# Patient Record
Sex: Male | Born: 1970 | Race: White | Hispanic: No | Marital: Single | Smoking: Former smoker
Health system: Southern US, Community
[De-identification: ages and names within clinical notes are randomized; demographics above are authoritative.]

## PROBLEM LIST (undated history)

## (undated) DIAGNOSIS — E1110 Type 2 diabetes mellitus with ketoacidosis without coma: Secondary | ICD-10-CM

## (undated) DIAGNOSIS — E785 Hyperlipidemia, unspecified: Secondary | ICD-10-CM

## (undated) DIAGNOSIS — H9190 Unspecified hearing loss, unspecified ear: Secondary | ICD-10-CM

## (undated) DIAGNOSIS — E119 Type 2 diabetes mellitus without complications: Secondary | ICD-10-CM

## (undated) DIAGNOSIS — N200 Calculus of kidney: Secondary | ICD-10-CM

## (undated) DIAGNOSIS — C119 Malignant neoplasm of nasopharynx, unspecified: Secondary | ICD-10-CM

## (undated) DIAGNOSIS — Z974 Presence of external hearing-aid: Secondary | ICD-10-CM

## (undated) DIAGNOSIS — S0993XA Unspecified injury of face, initial encounter: Secondary | ICD-10-CM

## (undated) HISTORY — DX: Type 2 diabetes mellitus without complications: E11.9

## (undated) HISTORY — PX: COLONOSCOPY: SHX174

## (undated) HISTORY — DX: Calculus of kidney: N20.0

## (undated) HISTORY — DX: Hyperlipidemia, unspecified: E78.5

## (undated) HISTORY — PX: NASOPHARYNGEAL BIOPSY: SHX6488

## (undated) HISTORY — PX: LARYNGOSCOPY: SUR817

## (undated) HISTORY — DX: Type 2 diabetes mellitus with ketoacidosis without coma: E11.10

---

## 1998-03-26 ENCOUNTER — Emergency Department (HOSPITAL_COMMUNITY): Admission: EM | Admit: 1998-03-26 | Discharge: 1998-03-26 | Payer: Self-pay | Admitting: Emergency Medicine

## 2005-07-13 ENCOUNTER — Emergency Department (HOSPITAL_COMMUNITY): Admission: EM | Admit: 2005-07-13 | Discharge: 2005-07-13 | Payer: Self-pay | Admitting: Emergency Medicine

## 2005-07-14 ENCOUNTER — Encounter: Admission: RE | Admit: 2005-07-14 | Discharge: 2005-07-14 | Payer: Self-pay | Admitting: Otolaryngology

## 2005-08-29 DIAGNOSIS — C119 Malignant neoplasm of nasopharynx, unspecified: Secondary | ICD-10-CM

## 2005-08-29 HISTORY — PX: PORT-A-CATH REMOVAL: SHX5289

## 2005-08-29 HISTORY — DX: Malignant neoplasm of nasopharynx, unspecified: C11.9

## 2005-08-29 HISTORY — PX: GASTROSTOMY W/ FEEDING TUBE: SUR642

## 2006-02-15 ENCOUNTER — Inpatient Hospital Stay (HOSPITAL_COMMUNITY): Admission: EM | Admit: 2006-02-15 | Discharge: 2006-02-26 | Payer: Self-pay | Admitting: Emergency Medicine

## 2006-02-15 ENCOUNTER — Ambulatory Visit: Payer: Self-pay | Admitting: Cardiology

## 2006-02-15 ENCOUNTER — Ambulatory Visit: Payer: Self-pay | Admitting: Pulmonary Disease

## 2006-05-28 IMAGING — CT CT NECK W/ CM
3 of 5 series · 14 of 27 positions shown, 17 images · IV contrast (75ML OMNI 300)
Comparison: none

CLINICAL DATA: 34 year old male with right tonsillar mass. 
CT NECK WITH CONTRAST:
TECHNIQUE: Multidetector CT imaging of the neck was performed following the standard protocol during administration of intravenous contrast.
Contrast:  75 cc Omnipaque 300

[Series 2: neck w/ · axial · 0.43mm/px · z∈[-266,-78]mm · 4 of 127 slices shown (1 of 2)]
[im 26/127  bone]
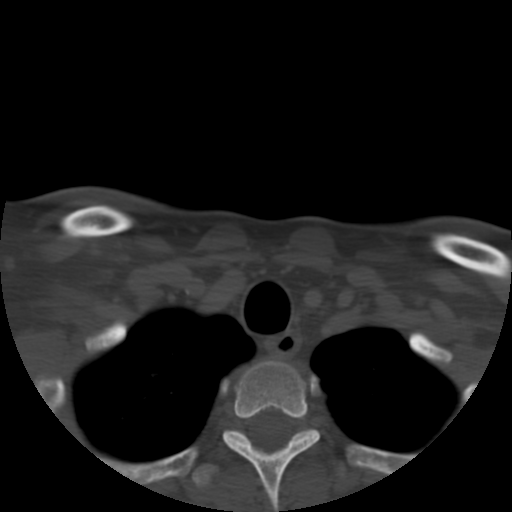
[im 51/127  bone]
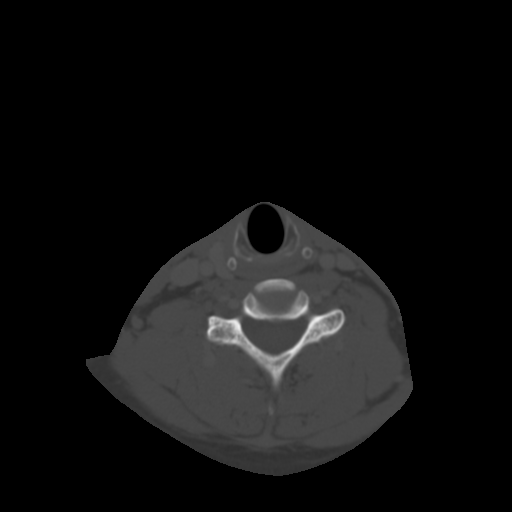
[im 76/127  bone]
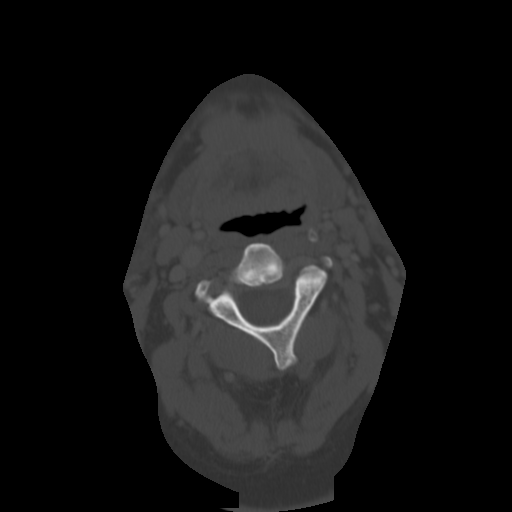
[im 101/127  bone]
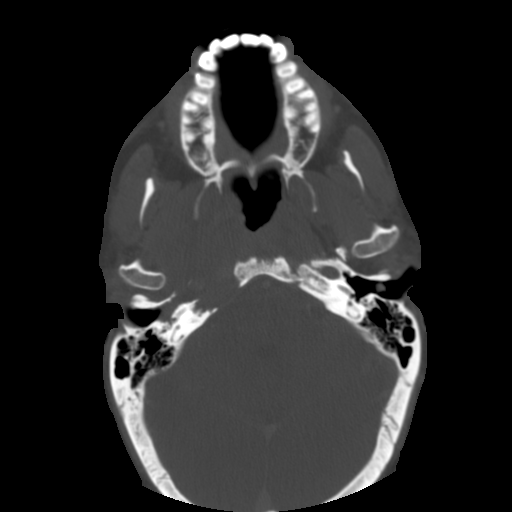

[Series 102: neck w/ · axial · 0.43mm/px · z∈[-276,-66]mm · 5 of 127 slices shown, 7 images (2 of 2)]
[im 22/127  soft-tissue]
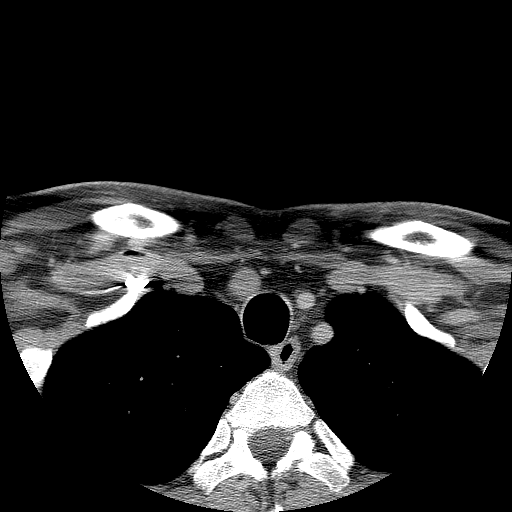
[im 22/127  bone]
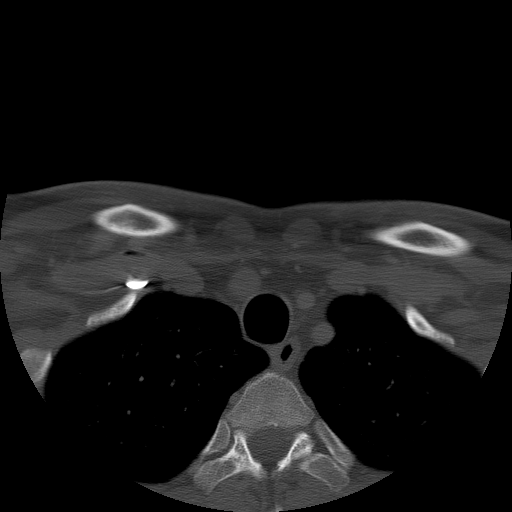
[im 43/127  bone]
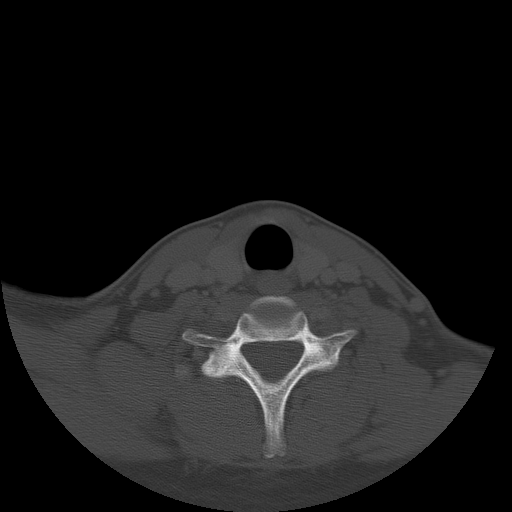
[im 64/127  bone]
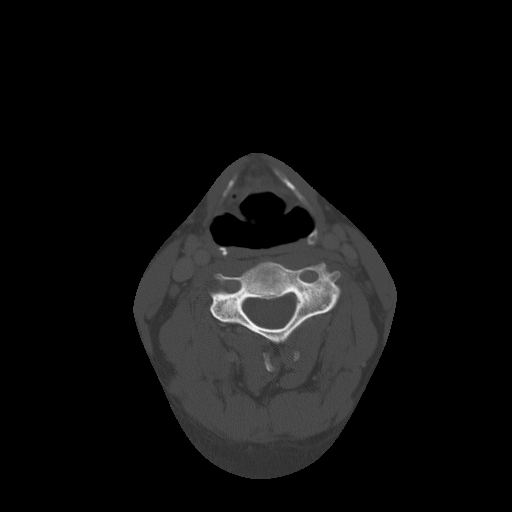
[im 85/127  bone]
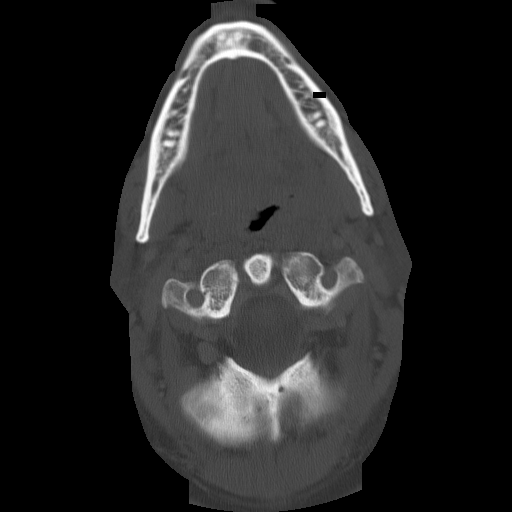
[im 106/127  soft-tissue]
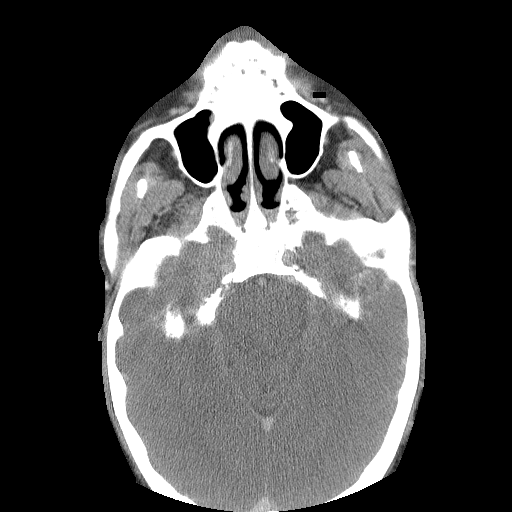
[im 106/127  bone]
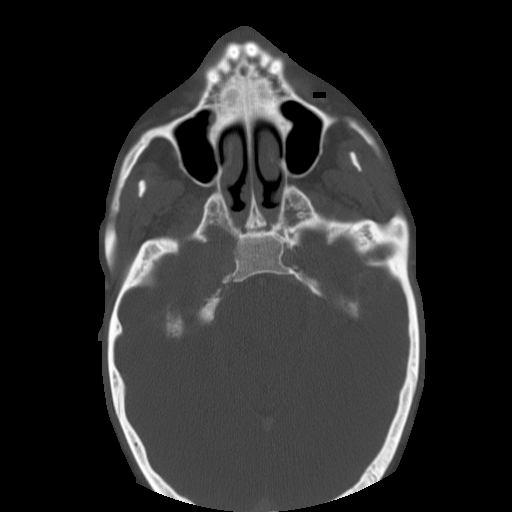

[Series 547: reformatted · coronal · 0.62mm/px · 5 of 65 slices shown, 6 images]
[im 10/65  bone]
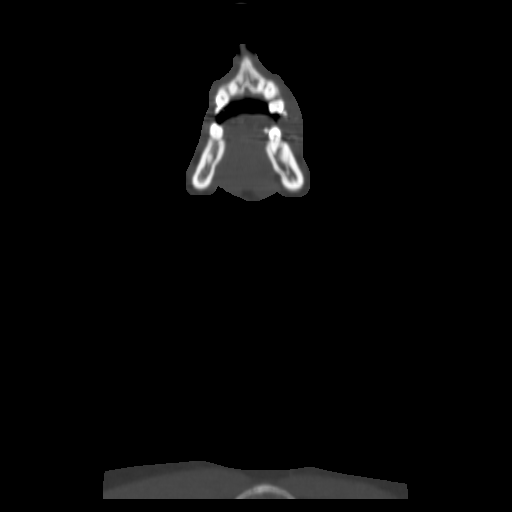
[im 19/65  soft-tissue]
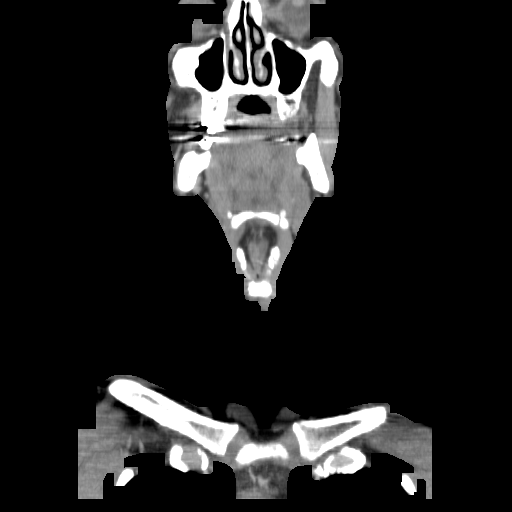
[im 19/65  bone]
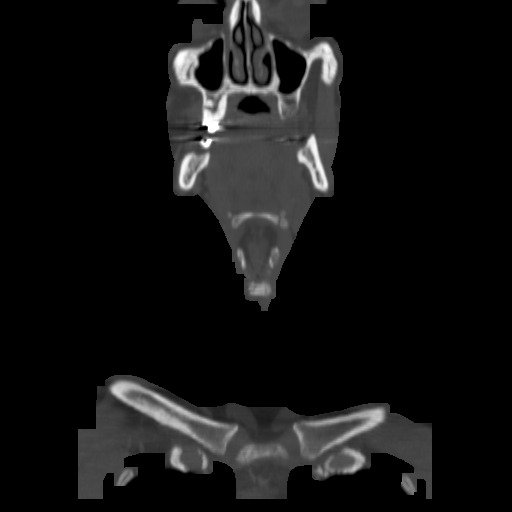
[im 28/65  bone]
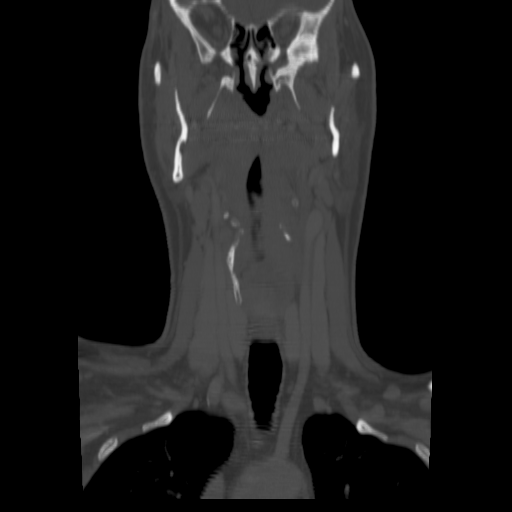
[im 37/65  bone]
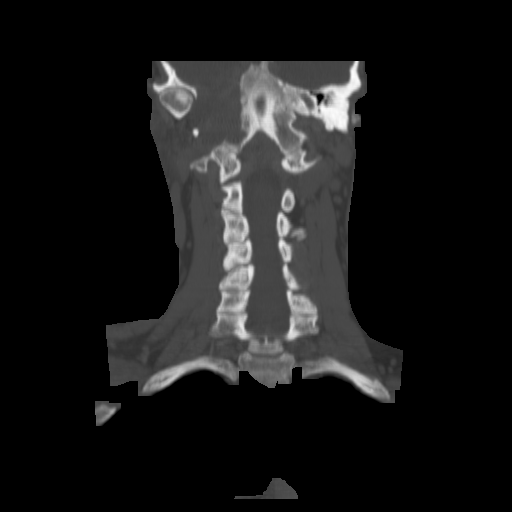
[im 46/65  bone]
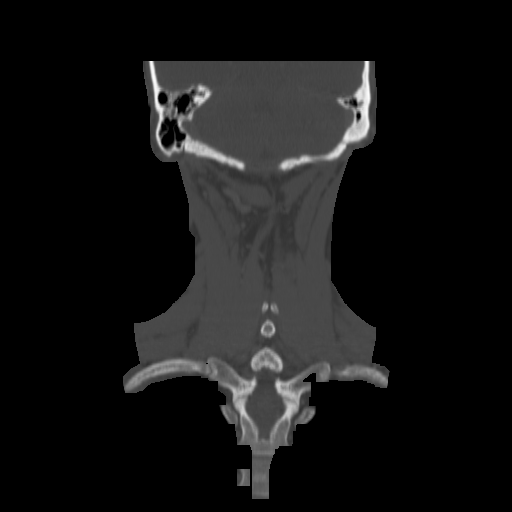

[14 of 27 positions shown; findings below may reference images not displayed]

FINDINGS: A 5.5 x 4.9 x 6.6 cm ill-defined mass is centered within the right palatine tonsillar region.  This displaces the tongue base anteriorly.  There may be extension into the tongue base .  The tumor extends superiorly through the skull base along the right petroclival fissure.  There is extensive bone erosion in the base of the right middle cranial fossa.  Tumor surrounds the hypoglossal canal, the petrous carotid canal and jugular foramen.  Intracranially, there is outward convexity of the right cavernous sinus suggestive of tumor infiltration, likely along the V3 branch.  The tumor margin is inseparable from the pterygoid musculature suggesting invasion into the masticator space as well.  
  The largest lymph node on the right side is a jugulodigastric node measuring 1.9 cm.  A rounded level II node on the left measures 1.8 cm in long axis.  Additional left level two enlarged nodes measure up to 1.7 cm in long axis.  No definite level III nodes are identified.  The larynx is unremarkable.
IMPRESSION: 1.  5.5 x 4.9 x 6.6 cm skull based mass lesion appears to be centered in the region of the right palatine tonsil.  This erodes the skull base on the right along the petroclival fissure.  There is strong suggestion of invasion into the cavernous sinus on the right as well as invasion into the right masticator space and potentially the tongue base.  Differential diagnosis includes a adenoid cystic carcinoma or less likely mucoepidermoid carcinoma.  Although there are several enlarged lymph nodes, these do not have the typical necrotic appearance of squamous cell cancer.  Also included in the differential is a chondrosarcoma given its location.  However, I do not see significant matrix to support this diagnosis.  
2.  There is significant involvement of the petrous cavernous carotid artery, and mass effect on the eustachian tube with some fluid in the mastoid air cells on the right.  The right V3 nerve, ninth, tenth, eleventh and twelfth nerves are also all potentially affected.  
These findings were discussed with Dr. Reema Street at [DATE], 07/14/05.

## 2007-01-01 IMAGING — XA IR PERC PLACEMENT GASTROSTOMY
1 series · 2 of 2 positions shown · non-contrast
Comparison: none

CLINICAL DATA: 18 French gastrostomy tube. 
 GASTROSTOMY TUBE EXCHANGE 02/17/06 AT 2003 HOURS:
 Procedure:  The existing gastrostomy tube was prepped and draped in sterile fashion.  Contrast was injected through it opacifying the stomach.  The balloon was deflated and it was exchanged for a 20 French balloon retention gastrostomy tube.  10 cc saline was instilled.  The balloon was secure and in place.  Contrast was injected opacifying the stomach.  No complications.

[Series 1000: run · 0.21mm/px · 2 of 2 slices shown]
[im 1/2]
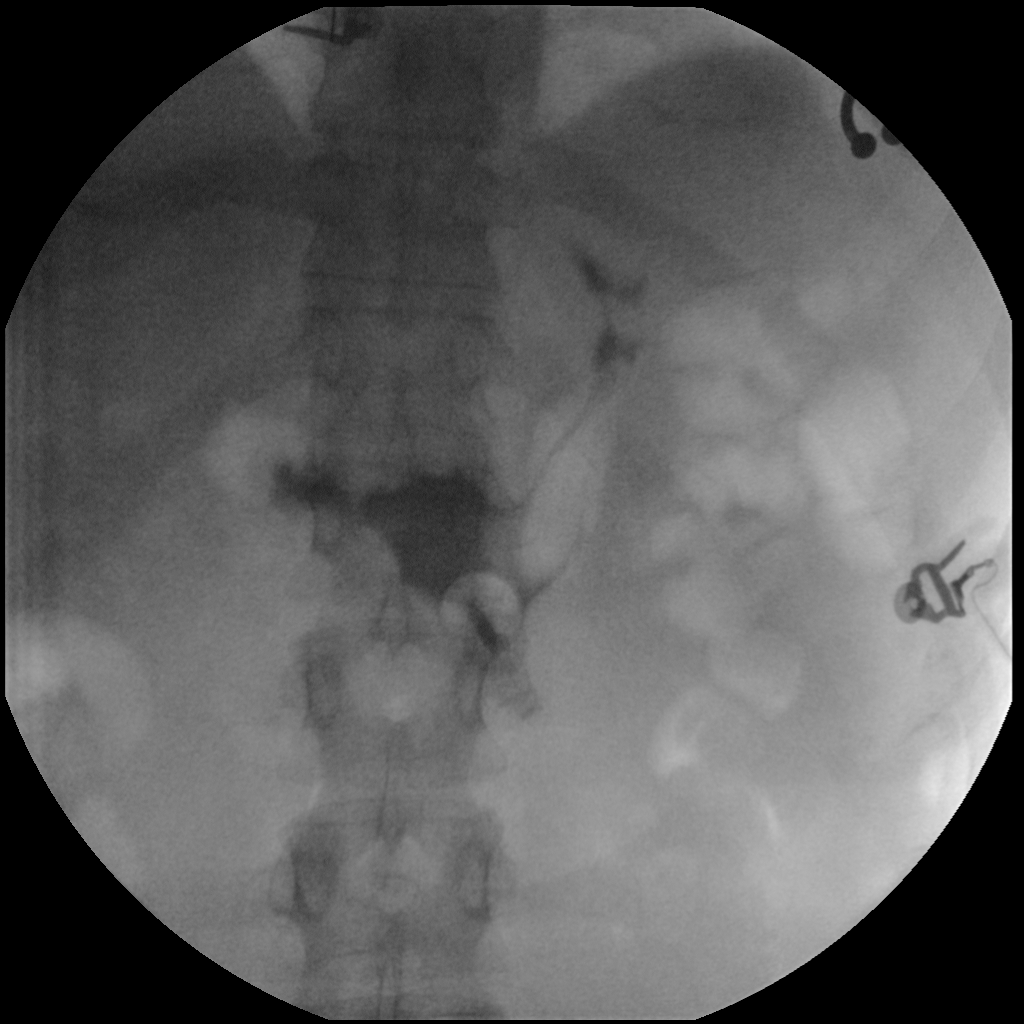
[im 2/2]
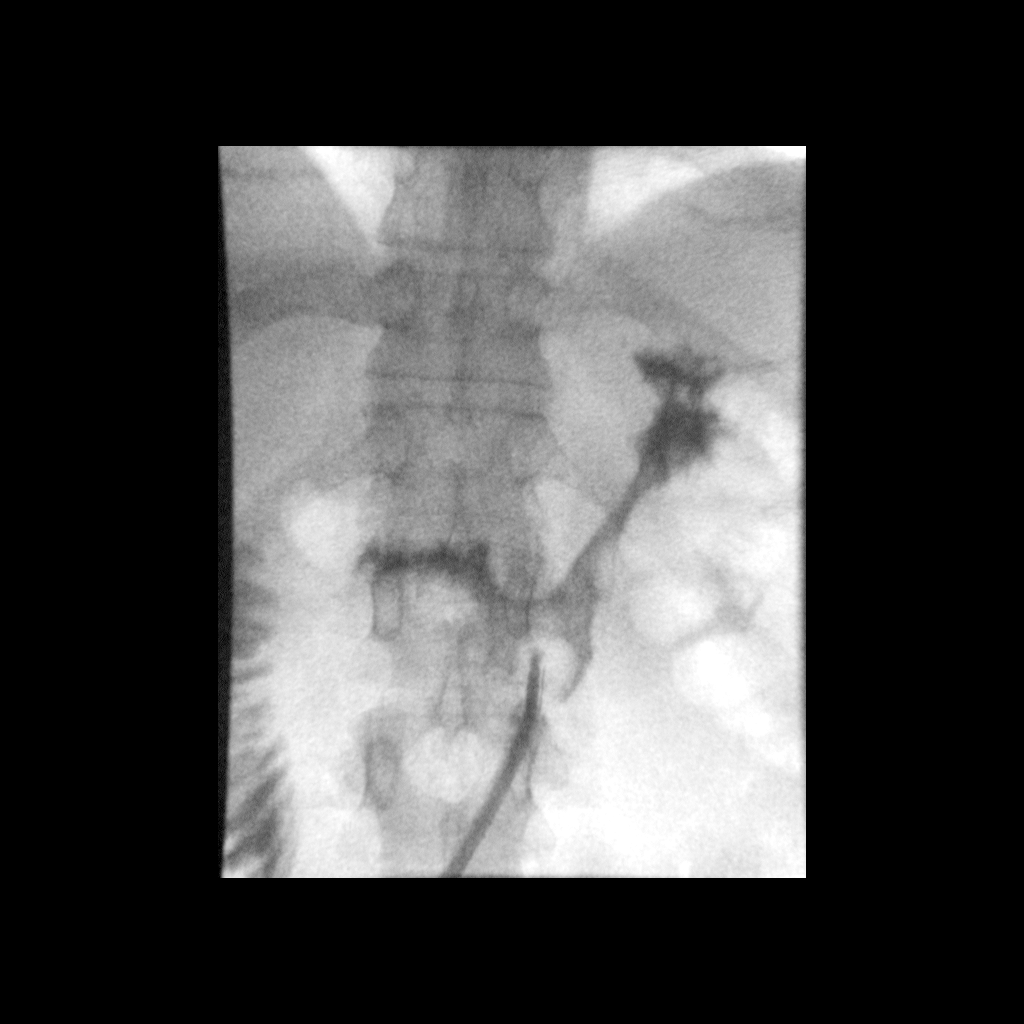

[2 of 2 positions shown; findings below may reference images not displayed]

FINDINGS: The image demonstrates exchange of the gastrostomy tube to a 20 French gastrostomy.
IMPRESSION: Successful gastrostomy tube exchange for 20 French balloon retention tube.

## 2013-12-02 DIAGNOSIS — C119 Malignant neoplasm of nasopharynx, unspecified: Secondary | ICD-10-CM | POA: Insufficient documentation

## 2013-12-02 DIAGNOSIS — M8738 Other secondary osteonecrosis, other site: Secondary | ICD-10-CM | POA: Insufficient documentation

## 2013-12-02 DIAGNOSIS — Y842 Radiological procedure and radiotherapy as the cause of abnormal reaction of the patient, or of later complication, without mention of misadventure at the time of the procedure: Secondary | ICD-10-CM

## 2013-12-06 ENCOUNTER — Telehealth: Payer: Self-pay

## 2013-12-06 NOTE — Telephone Encounter (Signed)
Unable to reach patient.  Invalid number.

## 2013-12-09 ENCOUNTER — Ambulatory Visit: Payer: Self-pay | Admitting: Family Medicine

## 2013-12-09 DIAGNOSIS — Z0289 Encounter for other administrative examinations: Secondary | ICD-10-CM

## 2013-12-11 NOTE — Telephone Encounter (Signed)
Missed appointment

## 2014-07-17 ENCOUNTER — Other Ambulatory Visit (INDEPENDENT_AMBULATORY_CARE_PROVIDER_SITE_OTHER): Payer: Self-pay | Admitting: Surgery

## 2014-10-17 ENCOUNTER — Encounter (HOSPITAL_BASED_OUTPATIENT_CLINIC_OR_DEPARTMENT_OTHER): Payer: Self-pay | Admitting: *Deleted

## 2014-10-17 NOTE — Progress Notes (Signed)
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

## 2014-10-21 ENCOUNTER — Other Ambulatory Visit (INDEPENDENT_AMBULATORY_CARE_PROVIDER_SITE_OTHER): Payer: Self-pay | Admitting: Surgery

## 2014-10-22 NOTE — H&P (Signed)
Jesse Newton  Location: [facility] Patient #: [id number] DOB: [birth year] Single / Language: Cleophus Molt / Race: White Male  History of Present Illness  Patient words: Intial visit LT inguinal hernia.  The patient is a 44 year old male who presents with an inguinal hernia. This is a very pleasant gentleman referred by Dr. Lysle Rubens for evaluation of a symptomatically inguinal hernia. He reports that he has had a hernia for approximately a year and is starting to get larger and causing increasing discomfort. He reports that it easily reduces. He has no obstructive symptoms. The discomfort is mild to moderate. He has no other complaints.   Other Problems  Cancer Diabetes Mellitus Inguinal Hernia Other disease, cancer, significant illness  Past Surgical History  Resection of Stomach  Diagnostic Studies History  Colonoscopy never  Allergies  No Known Drug Allergies11/19/2015  Medication History  No Current Medications  Social History  Alcohol use Occasional alcohol use. Caffeine use Coffee. No drug use Tobacco use Former smoker.  Family History  First Degree Relatives No pertinent family history  Review of Systems  General Not Present- Appetite Loss, Chills, Fatigue, Fever, Night Sweats, Weight Gain and Weight Loss. Skin Not Present- Change in Wart/Mole, Dryness, Hives, Jaundice, New Lesions, Non-Healing Wounds, Rash and Ulcer. HEENT Present- Hearing Loss and Ringing in the Ears. Not Present- Earache, Hoarseness, Nose Bleed, Oral Ulcers, Seasonal Allergies, Sinus Pain, Sore Throat, Visual Disturbances, Wears glasses/contact lenses and Yellow Eyes. Respiratory Not Present- Bloody sputum, Chronic Cough, Difficulty Breathing, Snoring and Wheezing. Breast Not Present- Breast Mass, Breast Pain, Nipple Discharge and Skin Changes. Cardiovascular Not Present- Chest Pain, Difficulty Breathing Lying Down, Leg Cramps, Palpitations, Rapid Heart Rate, Shortness  of Breath and Swelling of Extremities. Gastrointestinal Present- Difficulty Swallowing. Not Present- Abdominal Pain, Bloating, Bloody Stool, Change in Bowel Habits, Chronic diarrhea, Constipation, Excessive gas, Gets full quickly at meals, Hemorrhoids, Indigestion, Nausea, Rectal Pain and Vomiting. Male Genitourinary Present- Frequency. Not Present- Blood in Urine, Change in Urinary Stream, Impotence, Nocturia, Painful Urination, Urgency and Urine Leakage. Musculoskeletal Not Present- Back Pain, Joint Pain, Joint Stiffness, Muscle Pain, Muscle Weakness and Swelling of Extremities. Psychiatric Not Present- Anxiety, Bipolar, Change in Sleep Pattern, Depression, Fearful and Frequent crying. Hematology Not Present- Easy Bruising, Excessive bleeding, Gland problems, HIV and Persistent Infections.   Vitals ( 07/17/2014 3:50 PM Weight: 204.38 lb Height: 75in Body Surface Area: 2.21 m Body Mass Index: 25.54 kg/m Temp.: 98.67F(Temporal)  Pulse: 78 (Regular)  Resp.: 18 (Unlabored)  BP: 124/84 (Sitting, Left Arm, Standard)    Physical Exam ( General Mental Status-Alert. General Appearance-Consistent with stated age. Hydration-Well hydrated. Voice-Normal.  Head and Neck Head-normocephalic, atraumatic with no lesions or palpable masses. Trachea-midline.  Eye Eyeball - Bilateral-Extraocular movements intact. Sclera/Conjunctiva - Bilateral-No scleral icterus.  Chest and Lung Exam Chest and lung exam reveals -quiet, even and easy respiratory effort with no use of accessory muscles and on auscultation, normal breath sounds, no adventitious sounds and normal vocal resonance. Inspection Chest Wall - Normal. Back - normal.  Cardiovascular Cardiovascular examination reveals -normal heart sounds, regular rate and rhythm with no murmurs and normal pedal pulses bilaterally.  Abdomen Inspection Skin - Scar - no surgical scars. Hernias - Inguinal hernia - Left -  Reducible. Palpation/Percussion Palpation and Percussion of the abdomen reveal - Soft, Non Tender, No Rebound tenderness, No Rigidity (guarding) and No hepatosplenomegaly. Auscultation Auscultation of the abdomen reveals - Bowel sounds normal.  Neurologic Neurologic evaluation reveals -alert and oriented x 3 with  no impairment of recent or remote memory. Mental Status-Normal.  Musculoskeletal Normal Exam - Left-Upper Extremity Strength Normal and Lower Extremity Strength Normal. Normal Exam - Right-Upper Extremity Strength Normal, Lower Extremity Weakness.    Assessment & Plan  LEFT INGUINAL HERNIA (550.90  K40.90)  Impression: I discussed the diagnosis with the patient in detail. I discussed repair with mesh. I discussed with the laparoscopic and open techniques. I discussed the risks of surgery. These risks include but are not limited to bleeding, infection, chronic pain, nerve entrapment, recurrent hernia, etc.  He wishes to proceed with laparoscopic left inguinal hernia repair with mesh which will be scheduled.

## 2014-10-23 ENCOUNTER — Ambulatory Visit (HOSPITAL_BASED_OUTPATIENT_CLINIC_OR_DEPARTMENT_OTHER): Payer: 59 | Admitting: Certified Registered"

## 2014-10-23 ENCOUNTER — Encounter (HOSPITAL_BASED_OUTPATIENT_CLINIC_OR_DEPARTMENT_OTHER)
Admission: RE | Disposition: A | Discharge status: Home / Self Care | Payer: Self-pay | Source: Ambulatory Visit | Attending: Surgery

## 2014-10-23 ENCOUNTER — Ambulatory Visit (HOSPITAL_BASED_OUTPATIENT_CLINIC_OR_DEPARTMENT_OTHER)
Admission: RE | Admit: 2014-10-23 | Discharge: 2014-10-23 | Disposition: A | Discharge status: Home / Self Care | Payer: 59 | Source: Ambulatory Visit | Attending: Surgery | Admitting: Surgery

## 2014-10-23 ENCOUNTER — Encounter (HOSPITAL_BASED_OUTPATIENT_CLINIC_OR_DEPARTMENT_OTHER): Payer: Self-pay | Admitting: Certified Registered"

## 2014-10-23 DIAGNOSIS — Z859 Personal history of malignant neoplasm, unspecified: Secondary | ICD-10-CM | POA: Diagnosis not present

## 2014-10-23 DIAGNOSIS — E119 Type 2 diabetes mellitus without complications: Secondary | ICD-10-CM | POA: Diagnosis not present

## 2014-10-23 DIAGNOSIS — K4020 Bilateral inguinal hernia, without obstruction or gangrene, not specified as recurrent: Secondary | ICD-10-CM | POA: Insufficient documentation

## 2014-10-23 DIAGNOSIS — Z87891 Personal history of nicotine dependence: Secondary | ICD-10-CM | POA: Diagnosis not present

## 2014-10-23 DIAGNOSIS — K4090 Unilateral inguinal hernia, without obstruction or gangrene, not specified as recurrent: Secondary | ICD-10-CM | POA: Diagnosis present

## 2014-10-23 HISTORY — DX: Presence of external hearing-aid: Z97.4

## 2014-10-23 HISTORY — PX: INGUINAL HERNIA REPAIR: SHX194

## 2014-10-23 HISTORY — DX: Malignant neoplasm of nasopharynx, unspecified: C11.9

## 2014-10-23 HISTORY — DX: Unspecified injury of face, initial encounter: S09.93XA

## 2014-10-23 HISTORY — DX: Unspecified hearing loss, unspecified ear: H91.90

## 2014-10-23 HISTORY — PX: INSERTION OF MESH: SHX5868

## 2014-10-23 SURGERY — REPAIR, HERNIA, INGUINAL, LAPAROSCOPIC
Anesthesia: General | Laterality: Bilateral

## 2014-10-23 MED ORDER — MORPHINE SULFATE 2 MG/ML IJ SOLN: 1.0000 mg | INTRAMUSCULAR | Status: DC | PRN

## 2014-10-23 MED ORDER — PROPOFOL 10 MG/ML IV BOLUS
INTRAVENOUS | Status: DC | PRN
  Administered 2014-10-23: 200 mg via INTRAVENOUS

## 2014-10-23 MED ORDER — OXYCODONE-ACETAMINOPHEN 5-325 MG PO TABS: 1.0000 | ORAL_TABLET | ORAL | Status: DC | PRN

## 2014-10-23 MED ORDER — MIDAZOLAM HCL 2 MG/2ML IJ SOLN: 1.0000 mg | INTRAMUSCULAR | Status: DC | PRN

## 2014-10-23 MED ORDER — BUPIVACAINE-EPINEPHRINE (PF) 0.5% -1:200000 IJ SOLN
INTRAMUSCULAR | Status: DC | PRN
  Administered 2014-10-23: 10 mL

## 2014-10-23 MED ORDER — PROMETHAZINE HCL 25 MG/ML IJ SOLN: 6.2500 mg | INTRAMUSCULAR | Status: DC | PRN

## 2014-10-23 MED ORDER — MIDAZOLAM HCL 2 MG/2ML IJ SOLN
INTRAMUSCULAR | Status: AC
  Filled 2014-10-23: qty 2

## 2014-10-23 MED ORDER — ONDANSETRON HCL 4 MG/2ML IJ SOLN
INTRAMUSCULAR | Status: DC | PRN
  Administered 2014-10-23: 4 mg via INTRAVENOUS

## 2014-10-23 MED ORDER — SODIUM CHLORIDE 0.9 % IJ SOLN: 3.0000 mL | INTRAMUSCULAR | Status: DC | PRN

## 2014-10-23 MED ORDER — FENTANYL CITRATE 0.05 MG/ML IJ SOLN
25.0000 ug | INTRAMUSCULAR | Status: DC | PRN
  Administered 2014-10-23 (×2): 50 ug via INTRAVENOUS

## 2014-10-23 MED ORDER — FENTANYL CITRATE 0.05 MG/ML IJ SOLN
INTRAMUSCULAR | Status: DC | PRN
  Administered 2014-10-23: 100 ug via INTRAVENOUS
  Administered 2014-10-23 (×2): 50 ug via INTRAVENOUS

## 2014-10-23 MED ORDER — SODIUM CHLORIDE 0.9 % IJ SOLN: 3.0000 mL | Freq: Two times a day (BID) | INTRAMUSCULAR | Status: DC

## 2014-10-23 MED ORDER — SODIUM CHLORIDE 0.9 % IV SOLN: 250.0000 mL | INTRAVENOUS | Status: DC | PRN

## 2014-10-23 MED ORDER — LIDOCAINE HCL (CARDIAC) 20 MG/ML IV SOLN
INTRAVENOUS | Status: DC | PRN
  Administered 2014-10-23: 60 mg via INTRAVENOUS

## 2014-10-23 MED ORDER — ACETAMINOPHEN 650 MG RE SUPP: 650.0000 mg | RECTAL | Status: DC | PRN

## 2014-10-23 MED ORDER — GLYCOPYRROLATE 0.2 MG/ML IJ SOLN
INTRAMUSCULAR | Status: DC | PRN
  Administered 2014-10-23: 0.2 mg via INTRAVENOUS

## 2014-10-23 MED ORDER — CEFAZOLIN SODIUM-DEXTROSE 2-3 GM-% IV SOLR
2.0000 g | INTRAVENOUS | Status: AC
  Administered 2014-10-23: 2 g via INTRAVENOUS

## 2014-10-23 MED ORDER — CEFAZOLIN SODIUM-DEXTROSE 2-3 GM-% IV SOLR
INTRAVENOUS | Status: AC
  Filled 2014-10-23: qty 50

## 2014-10-23 MED ORDER — BUPIVACAINE-EPINEPHRINE (PF) 0.5% -1:200000 IJ SOLN
INTRAMUSCULAR | Status: AC
  Filled 2014-10-23: qty 30

## 2014-10-23 MED ORDER — FENTANYL CITRATE 0.05 MG/ML IJ SOLN
INTRAMUSCULAR | Status: AC
  Filled 2014-10-23: qty 2

## 2014-10-23 MED ORDER — OXYCODONE HCL 5 MG PO TABS: 5.0000 mg | ORAL_TABLET | ORAL | Status: DC | PRN

## 2014-10-23 MED ORDER — KETOROLAC TROMETHAMINE 30 MG/ML IJ SOLN
INTRAMUSCULAR | Status: DC | PRN
  Administered 2014-10-23: 30 mg via INTRAVENOUS

## 2014-10-23 MED ORDER — FENTANYL CITRATE 0.05 MG/ML IJ SOLN
INTRAMUSCULAR | Status: AC
  Filled 2014-10-23: qty 6

## 2014-10-23 MED ORDER — EPHEDRINE SULFATE 50 MG/ML IJ SOLN
INTRAMUSCULAR | Status: DC | PRN
  Administered 2014-10-23: 10 mg via INTRAVENOUS

## 2014-10-23 MED ORDER — FENTANYL CITRATE 0.05 MG/ML IJ SOLN: 50.0000 ug | INTRAMUSCULAR | Status: DC | PRN

## 2014-10-23 MED ORDER — DEXAMETHASONE SODIUM PHOSPHATE 4 MG/ML IJ SOLN
INTRAMUSCULAR | Status: DC | PRN
  Administered 2014-10-23: 10 mg via INTRAVENOUS

## 2014-10-23 MED ORDER — ACETAMINOPHEN 325 MG PO TABS: 650.0000 mg | ORAL_TABLET | ORAL | Status: DC | PRN

## 2014-10-23 MED ORDER — MIDAZOLAM HCL 5 MG/5ML IJ SOLN
INTRAMUSCULAR | Status: DC | PRN
  Administered 2014-10-23: 1 mg via INTRAVENOUS

## 2014-10-23 MED ORDER — LACTATED RINGERS IV SOLN
INTRAVENOUS | Status: DC
  Administered 2014-10-23 (×2): via INTRAVENOUS

## 2014-10-23 MED ORDER — SUCCINYLCHOLINE CHLORIDE 20 MG/ML IJ SOLN
INTRAMUSCULAR | Status: DC | PRN
  Administered 2014-10-23: 130 mg via INTRAVENOUS

## 2014-10-23 MED ORDER — KETOROLAC TROMETHAMINE 30 MG/ML IJ SOLN: 30.0000 mg | Freq: Once | INTRAMUSCULAR | Status: DC | PRN

## 2014-10-23 SURGICAL SUPPLY — 42 items
APL SKNCLS STERI-STRIP NONHPOA (GAUZE/BANDAGES/DRESSINGS)
APPLIER CLIP LOGIC TI 5 (MISCELLANEOUS) IMPLANT
APR CLP MED LRG 33X5 (MISCELLANEOUS)
BANDAGE ADH SHEER 1  50/CT (GAUZE/BANDAGES/DRESSINGS) ×3 IMPLANT
BENZOIN TINCTURE PRP APPL 2/3 (GAUZE/BANDAGES/DRESSINGS) ×1 IMPLANT
BLADE CLIPPER SURG (BLADE) ×1 IMPLANT
CHLORAPREP W/TINT 26ML (MISCELLANEOUS) ×2 IMPLANT
DEVICE SECURE STRAP 25 ABSORB (INSTRUMENTS) ×2 IMPLANT
DISSECT BALLN SPACEMKR + OVL (BALLOONS) ×2
DISSECTOR BALLN SPACEMKR + OVL (BALLOONS) ×1 IMPLANT
DISSECTOR BLUNT TIP ENDO 5MM (MISCELLANEOUS) IMPLANT
DRAPE UTILITY XL STRL (DRAPES) ×1 IMPLANT
ELECT REM PT RETURN 9FT ADLT (ELECTROSURGICAL) ×2
ELECTRODE REM PT RTRN 9FT ADLT (ELECTROSURGICAL) ×1 IMPLANT
GLOVE BIOGEL PI IND STRL 7.0 (GLOVE) IMPLANT
GLOVE BIOGEL PI IND STRL 7.5 (GLOVE) IMPLANT
GLOVE BIOGEL PI INDICATOR 7.0 (GLOVE)
GLOVE BIOGEL PI INDICATOR 7.5 (GLOVE) ×1
GLOVE ECLIPSE 6.5 STRL STRAW (GLOVE) IMPLANT
GLOVE SURG SIGNA 7.5 PF LTX (GLOVE) ×2 IMPLANT
GLOVE SURG SS PI 7.5 STRL IVOR (GLOVE) ×1 IMPLANT
GOWN STRL REUS W/ TWL LRG LVL3 (GOWN DISPOSABLE) ×3 IMPLANT
GOWN STRL REUS W/ TWL XL LVL3 (GOWN DISPOSABLE) ×1 IMPLANT
GOWN STRL REUS W/TWL LRG LVL3 (GOWN DISPOSABLE) ×2
GOWN STRL REUS W/TWL XL LVL3 (GOWN DISPOSABLE) ×2
LIQUID BAND (GAUZE/BANDAGES/DRESSINGS) ×1 IMPLANT
MESH 3DMAX 4X6 LT LRG (Mesh General) ×1 IMPLANT
MESH 3DMAX 4X6 RT LRG (Mesh General) ×1 IMPLANT
NDL INSUFFLATION 14GA 120MM (NEEDLE) IMPLANT
NEEDLE INSUFFLATION 14GA 120MM (NEEDLE) IMPLANT
NS IRRIG 1000ML POUR BTL (IV SOLUTION) ×1 IMPLANT
PACK BASIN DAY SURGERY FS (CUSTOM PROCEDURE TRAY) ×2 IMPLANT
SCISSORS LAP 5X35 DISP (ENDOMECHANICALS) IMPLANT
SET IRRIG TUBING LAPAROSCOPIC (IRRIGATION / IRRIGATOR) IMPLANT
SET TROCAR LAP APPLE-HUNT 5MM (ENDOMECHANICALS) ×2 IMPLANT
SLEEVE SCD COMPRESS KNEE MED (MISCELLANEOUS) ×2 IMPLANT
STRIP CLOSURE SKIN 1/2X4 (GAUZE/BANDAGES/DRESSINGS) IMPLANT
SUT MNCRL AB 4-0 PS2 18 (SUTURE) ×2 IMPLANT
TOWEL OR 17X24 6PK STRL BLUE (TOWEL DISPOSABLE) ×3 IMPLANT
TRAY FOLEY CATH 16FR SILVER (SET/KITS/TRAYS/PACK) ×2 IMPLANT
TRAY LAPAROSCOPIC (CUSTOM PROCEDURE TRAY) ×2 IMPLANT
TUBING INSUFFLATION (TUBING) ×1 IMPLANT

## 2014-10-23 NOTE — Anesthesia Preprocedure Evaluation (Addendum)
Anesthesia Evaluation  Patient identified by MRN, date of birth, ID band Patient awake    Reviewed: Allergy & Precautions, NPO status   Airway Mallampati: III  TM Distance: >3 FB Neck ROM: Full  Mouth opening: Limited Mouth Opening  Dental no notable dental hx.    Pulmonary former smoker,  breath sounds clear to auscultation  Pulmonary exam normal       Cardiovascular Rhythm:Regular Rate:Normal     Neuro/Psych    GI/Hepatic   Endo/Other    Renal/GU      Musculoskeletal   Abdominal   Peds  Hematology   Anesthesia Other Findings   Reproductive/Obstetrics                           Anesthesia Physical Anesthesia Plan  ASA: I  Anesthesia Plan: General   Post-op Pain Management:    Induction: Intravenous  Airway Management Planned: Oral ETT and Video Laryngoscope Planned  Additional Equipment:   Intra-op Plan:   Post-operative Plan: Extubation in OR  Informed Consent: I have reviewed the patients History and Physical, chart, labs and discussed the procedure including the risks, benefits and alternatives for the proposed anesthesia with the patient or authorized representative who has indicated his/her understanding and acceptance.   Dental advisory given  Plan Discussed with: CRNA and Surgeon  Anesthesia Plan Comments: (Limited mouth opening but with full ROM of neck. Will intubate with sux and glidescope)       Anesthesia Quick Evaluation

## 2014-10-23 NOTE — Op Note (Signed)
LAPAROSCOPIC INGUINAL HERNIA, INSERTION OF MESH  Procedure Note  Jesse Newton 10/23/2014   Pre-op Diagnosis: Left Inguinal Hernia     Post-op Diagnosis: bilateral direct inguinal hernia  Procedure(s): LAPAROSCOPIC BILATERAL INGUINAL HERNIA INSERTION OF MESH  Surgeon(s): Coralie Keens, MD  Anesthesia: General  Staff:  Circulator: Timmie Foerster Ward, RN Relief Circulator: Burt Ek, RN Scrub Person: Maurene Capes, RN; Joaquin Courts V, RN  Estimated Blood Loss: Minimal                         Jesse Newton   Date: 10/23/2014  Time: 12:21 PM

## 2014-10-23 NOTE — Interval H&P Note (Signed)
History and Physical Interval Note: no change in H and P  10/23/2014 10:31 AM  Jesse Newton  has presented today for surgery, with the diagnosis of Left Inguinal Hernia  The various methods of treatment have been discussed with the patient and family. After consideration of risks, benefits and other options for treatment, the patient has consented to  Procedure(s): LAPAROSCOPIC INGUINAL HERNIA (N/A) INSERTION OF MESH (N/A) as a surgical intervention .  The patient's history has been reviewed, patient examined, no change in status, stable for surgery.  I have reviewed the patient's chart and labs.  Questions were answered to the patient's satisfaction.     Rhianon Zabawa A

## 2014-10-23 NOTE — Discharge Instructions (Signed)
[facility] ______[facility] LAPAROSCOPIC SURGERY: POST OP INSTRUCTIONS Always review your discharge instruction sheet given to you by the facility where your surgery was performed. IF YOU HAVE DISABILITY OR FAMILY LEAVE FORMS, YOU MUST BRING THEM TO THE OFFICE FOR PROCESSING.   DO NOT GIVE THEM TO YOUR DOCTOR.  1. A prescription for pain medication may be given to you upon discharge.  Take your pain medication as prescribed, if needed.  If narcotic pain medicine is not needed, then you may take acetaminophen (Tylenol) or ibuprofen (Advil) as needed. 2. Take your usually prescribed medications unless otherwise directed. 3. If you need a refill on your pain medication, please contact your pharmacy.  They will contact our office to request authorization. Prescriptions will not be filled after 5pm or on week-ends. 4. You should follow a light diet the first few days after arrival home, such as soup and crackers, etc.  Be sure to include lots of fluids daily. 5. Most patients will experience some swelling and bruising in the area of the incisions.  Ice packs will help.  Swelling and bruising can take several days to resolve.  6. It is common to experience some constipation if taking pain medication after surgery.  Increasing fluid intake and taking a stool softener (such as Colace) will usually help or prevent this problem from occurring.  A mild laxative (Milk of Magnesia or Miralax) should be taken according to package instructions if there are no bowel movements after 48 hours. 7. Unless discharge instructions indicate otherwise, you may remove your bandages 24-48 hours after surgery, and you may shower at that time.  You may have steri-strips (small skin tapes) in place directly over the incision.  These strips should be left on the skin for 7-10 days.  If your surgeon used skin glue on the incision, you may shower in 24 hours.  The glue will flake off over the next 2-3 weeks.  Any sutures or  staples will be removed at the office during your follow-up visit. 8. ACTIVITIES:  You may resume regular (light) daily activities beginning the next day--such as daily self-care, walking, climbing stairs--gradually increasing activities as tolerated.  You may have sexual intercourse when it is comfortable.  Refrain from any heavy lifting or straining until approved by your doctor. a. You may drive when you are no longer taking prescription pain medication, you can comfortably wear a seatbelt, and you can safely maneuver your car and apply brakes. b. RETURN TO WORK:  __________________________________________________________ 9. You should see your doctor in the office for a follow-up appointment approximately 2-3 weeks after your surgery.  Make sure that you call for this appointment within a day or two after you arrive home to insure a convenient appointment time. 10. OTHER INSTRUCTIONS: ____NO LIFTING MORE THAN 15 POUNDS FOR 3 WEEKS. 11. ICE PACK AND IBUPROFEN ALSO FOR PAIN 12. ______________________________________________________________________________________________________________________ __________________________________________________________________________________________________________________________ WHEN TO CALL YOUR DOCTOR: 1. Fever over 101.0 2. Inability to urinate 3. Continued bleeding from incision. 4. Increased pain, redness, or drainage from the incision. 5. Increasing abdominal pain  The clinic staff is available to answer your questions during regular business hours.  Please dont hesitate to call and ask to speak to one of the nurses for clinical concerns.  If you have a medical emergency, go to the nearest emergency room or call 911.  A surgeon from [facility] is always on call at the hospital. [address] [place], [place], [place]  [address] ?  [id number], [place], [place]   [address] [phone] ? [phone] ? FAX [phone] Web site:  [email or web address]   Post Anesthesia Home Care Instructions  Activity: Get plenty of rest for the remainder of the day. A responsible adult should stay with you for 24 hours following the procedure.  For the next 24 hours, DO NOT: -Drive a car -Paediatric nurse -Drink alcoholic beverages -Take any medication unless instructed by your physician -Make any legal decisions or sign important papers.  Meals: Start with liquid foods such as gelatin or soup. Progress to regular foods as tolerated. Avoid greasy, spicy, heavy foods. If nausea and/or vomiting occur, drink only clear liquids until the nausea and/or vomiting subsides. Call your physician if vomiting continues.  Special Instructions/Symptoms: Your throat may feel dry or sore from the anesthesia or the breathing tube placed in your throat during surgery. If this causes discomfort, gargle with warm salt water. The discomfort should disappear within 24 hours.

## 2014-10-23 NOTE — Transfer of Care (Signed)
Immediate Anesthesia Transfer of Care Note  Patient: Jesse Newton  Procedure(s) Performed: Procedure(s): LAPAROSCOPIC INGUINAL HERNIA (Bilateral) INSERTION OF MESH (Bilateral)  Patient Location: PACU  Anesthesia Type:General  Level of Consciousness: awake, sedated and patient cooperative  Airway & Oxygen Therapy: Patient Spontanous Breathing and Patient connected to face mask oxygen  Post-op Assessment: Report given to RN and Post -op Vital signs reviewed and stable  Post vital signs: Reviewed and stable  Last Vitals:  Filed Vitals:   10/23/14 0914  BP: 133/87  Pulse: 71  Temp: 36.7 C  Resp: 20    Complications: No apparent anesthesia complications

## 2014-10-23 NOTE — Anesthesia Postprocedure Evaluation (Signed)
Anesthesia Post Note  Patient: Jesse Newton  Procedure(s) Performed: Procedure(s) (LRB): LAPAROSCOPIC INGUINAL HERNIA (Bilateral) INSERTION OF MESH (Bilateral)  Anesthesia type: General  Patient location: PACU  Post pain: Pain level controlled and Adequate analgesia  Post assessment: Post-op Vital signs reviewed, Patient's Cardiovascular Status Stable, Respiratory Function Stable, Patent Airway and Pain level controlled  Last Vitals:  Filed Vitals:   10/23/14 1300  BP: 136/86  Pulse: 70  Temp:   Resp: 17    Post vital signs: Reviewed and stable  Level of consciousness: awake, alert  and oriented  Complications: No apparent anesthesia complications

## 2014-10-23 NOTE — Anesthesia Procedure Notes (Signed)
Procedure Name: Intubation Date/Time: 10/23/2014 11:26 AM Performed by: Baxter Flattery Pre-anesthesia Checklist: Patient identified, Emergency Drugs available, Suction available and Patient being monitored Patient Re-evaluated:Patient Re-evaluated prior to inductionOxygen Delivery Method: Circle System Utilized Preoxygenation: Pre-oxygenation with 100% oxygen Intubation Type: IV induction Ventilation: Mask ventilation without difficulty Laryngoscope Size: Glidescope and 4 Grade View: Grade III Tube type: Oral Tube size: 7.0 mm Number of attempts: 1 Airway Equipment and Method: Stylet and Video-laryngoscopy Placement Confirmation: ETT inserted through vocal cords under direct vision,  positive ETCO2 and breath sounds checked- equal and bilateral Secured at: 23 cm Tube secured with: Tape Dental Injury: Teeth and Oropharynx as per pre-operative assessment

## 2014-10-24 NOTE — Op Note (Signed)
NAMEQUADE, RAMIREZ                 ACCOUNT NO.:  [id number]  MEDICAL RECORD NO.:  [id number]  LOCATION:                                 FACILITY:  PHYSICIAN:  Coralie Keens, M.D. DATE OF BIRTH:  1970/11/18  DATE OF PROCEDURE:  10/23/2014 DATE OF DISCHARGE:  10/23/2014                              OPERATIVE REPORT   PREOPERATIVE DIAGNOSIS:  Left inguinal hernia.  POSTOPERATIVE DIAGNOSIS:  Bilateral direct inguinal hernias.  PROCEDURES:  Bilateral laparoscopic inguinal hernia repair with mesh.  SURGEON:  Coralie Keens, M.D.  ANESTHESIA:  General with 0.5% Marcaine.  ESTIMATED BLOOD LOSS:  Minimal.  FINDINGS:  The patient was found to have bilateral direct inguinal hernias which were repaired with 2 separate pieces of Bard Prolene 3DMax large mesh.  PROCEDURE IN DETAIL:  The patient was brought to the operating room, identified as Jesse Newton.  She was placed supine on the operating room table.  General anesthesia was induced.  A Foley catheter was then inserted.  His abdomen was then prepped and draped in usual sterile fashion.  I made a small vertical incision below the umbilicus.  I carried this down to the fascia which was opened just to the left of the midline.  The rectus muscles were identified and elevated.  The dissecting balloon was then passed underneath the rectus muscle and manipulated towards the pubis under direct vision.  The dissecting balloon was then insufflated under direct vision dissecting out the preperitoneal space.  The dissecting balloon was then removed and insufflation was begun with carbon dioxide.  I then placed two 5 mm ports in the patient's midline under direct vision.  I dissected out the left inguinal area first.  The patient had a small direct inguinal hernia without evidence of indirect inguinal hernia.  I evaluated the cord in its entirety to again discern this.  I then turned my attention towards the right groin, again found what  appeared to be larger direct inguinal hernia on the right.  Again, there was no evidence of indirect right inguinal hernia.  At this point, a piece of Bard right-sided Prolene large 3DMax mesh was brought onto the field.  I placed it through the port of the umbilicus and opened as an onlay on the right inguinal floor.  I then tacked it to Cooper's ligament up the medial abdominal wall, slightly laterally with the absorbable tacker.  I then brought a left-sided piece of Bard 3DMax Prolene mesh onto the field as well.  I placed this through the port and opened as an onlay on the left inguinal floor as well.  I then tacked it to Cooper's ligament up the medial abdominal wall and slightly laterally with the absorbable tacker. Wide coverage of both direct defect and cord structures appeared to be achieved.  The midline ports were removed, and the preperitoneal space was seen to collapse appropriately with the mesh staying in place.  I then closed the fascia at the umbilicus with figure-of-eight 0 Vicryl suture.  All incisions were then anesthetized with Marcaine, and I performed bilateral ilioinguinal nerve blocks with Marcaine as well. All incisions were then closed with 4-0 Monocryl and skin  glue.  The patient tolerated the procedure well.  All the counts were correct at the end of the procedure.  The patient was then extubated in the operating room and taken in stable condition to recovery room.     Coralie Keens, M.D.     DB/MEDQ  D:  10/23/2014  T:  10/24/2014  Job:  [id number]

## 2014-10-27 LAB — POCT HEMOGLOBIN-HEMACUE: Hemoglobin: 15.4 g/dL (ref 13.0–17.0)

## 2014-10-28 ENCOUNTER — Encounter (HOSPITAL_BASED_OUTPATIENT_CLINIC_OR_DEPARTMENT_OTHER): Payer: Self-pay | Admitting: Surgery

## 2016-10-18 ENCOUNTER — Ambulatory Visit (INDEPENDENT_AMBULATORY_CARE_PROVIDER_SITE_OTHER): Payer: 59 | Admitting: Podiatry

## 2016-10-18 ENCOUNTER — Encounter: Payer: Self-pay | Admitting: *Deleted

## 2016-10-18 ENCOUNTER — Encounter: Payer: Self-pay | Admitting: Podiatry

## 2016-10-18 ENCOUNTER — Other Ambulatory Visit: Payer: Self-pay | Admitting: *Deleted

## 2016-10-18 VITALS — BP 120/83 | HR 72 | Resp 16

## 2016-10-18 DIAGNOSIS — L600 Ingrowing nail: Secondary | ICD-10-CM | POA: Diagnosis not present

## 2016-10-18 DIAGNOSIS — L603 Nail dystrophy: Secondary | ICD-10-CM

## 2016-10-18 MED ORDER — NEOMYCIN-POLYMYXIN-HC 1 % OT SOLN: OTIC | 1 refills | Status: DC

## 2016-10-18 NOTE — Progress Notes (Signed)
   Subjective:    Patient ID: Jesse Newton, male    DOB: 12-21-1970, 46 y.o.   MRN: [id number]  HPI: He presents today with a chief complaint of ingrown toenail to the tibiofibular border of the hallux left. He states that he had while his right foot at one time and he was taking care of. He states that it simultaneously removed and cured fungus to the same nail plate. He thinks he has a fungus to the hallux nail plate left where the ingrown nails are today. He is questioning whether or not removing the margins will resolve the nail fungus.    Review of Systems  All other systems reviewed and are negative.      Objective:   Physical Exam: Vital signs are stable he is alert and oriented 3. Pulses are palpable. Neurologic sensorium is intact. Deep tendon reflexes are intact. Muscle strength is 5 over 5 dorsiflexion plantar flexors and inverters everters all internal musculatures intact. Orthopedic evaluation of his friends all joints distal to the ankle range of motion without crepitation. Cutaneous evaluation sharp incurvated nail margins to the medial and lateral border the hallux nail plate left. Mild erythema along the margins. He also has thickening of the toenail with discoloration distally subungual material was present. There is mild tenderness on palpation of this area. He also has some thickening of the hallux nail right with without discoloration.        Assessment & Plan:  Assessment: Ingrown nail with probable nail dystrophy hallux left cannot rule out onychomycosis.  Plan: We performed a chemical matrixectomy to the tibiofibular border of the hallux left today. He tolerated this procedure well without complication. Local anesthetic had been administered and he had no intraoperative pain. He was provided with oral and written home-going instructions for care and soaking of his toe as well as a prescription for Cortisporin Otic to be applied twice daily after soaking. I also took  samples of this nail as well as the hallux nail right which was a little thicker but not discolored. We sent the samples along with tissue for pathologic evaluation. I will follow-up with him in 2 weeks.

## 2016-10-18 NOTE — Patient Instructions (Signed)

## 2016-11-01 ENCOUNTER — Ambulatory Visit (INDEPENDENT_AMBULATORY_CARE_PROVIDER_SITE_OTHER): Payer: 59 | Admitting: Podiatry

## 2016-11-01 ENCOUNTER — Encounter: Payer: Self-pay | Admitting: Podiatry

## 2016-11-01 DIAGNOSIS — L603 Nail dystrophy: Secondary | ICD-10-CM | POA: Diagnosis not present

## 2016-11-01 MED ORDER — TERBINAFINE HCL 250 MG PO TABS: 250.0000 mg | ORAL_TABLET | Freq: Every day | ORAL | 0 refills | Status: DC

## 2016-11-01 NOTE — Progress Notes (Signed)
He presents today for follow-up of his pathology report.  Objective: Vital signs are stable alert and oriented 3. Pulses are palpable. Pathology report does demonstrate a separate phytic fungus.  Assessment: Onychomycosis.  Plan: Started him on terbinafine amble blood work was performed. We will follow-up with him in 1 month at which time we will consider more blood work and a longer prescription. (He saw Dr. Amalia Hailey today)

## 2016-12-06 ENCOUNTER — Encounter: Payer: Self-pay | Admitting: Podiatry

## 2016-12-06 ENCOUNTER — Ambulatory Visit (INDEPENDENT_AMBULATORY_CARE_PROVIDER_SITE_OTHER): Payer: 59 | Admitting: Podiatry

## 2016-12-06 ENCOUNTER — Telehealth: Payer: Self-pay | Admitting: *Deleted

## 2016-12-06 DIAGNOSIS — L603 Nail dystrophy: Secondary | ICD-10-CM

## 2016-12-06 DIAGNOSIS — Z79899 Other long term (current) drug therapy: Secondary | ICD-10-CM

## 2016-12-06 MED ORDER — TERBINAFINE HCL 250 MG PO TABS: 250.0000 mg | ORAL_TABLET | Freq: Every day | ORAL | 0 refills | Status: DC

## 2016-12-06 NOTE — Telephone Encounter (Addendum)
Pt states had blood work at Dr. Marisa Sprinkles office and would like our office to request copy so he can begin medication. Request faxed to Dr. Abbott Pao. 12/07/2016-I informed pt, Dr. Milinda Pointer stated the labs were good and could begin the lamisil. 02/15/2017-Received request for refill and pre-cert of Lamisil. Denied refill until pt is seen in office by Dr. Milinda Pointer. I informed pt that a refill request and pre-cert had been sent from [facility] for Lamisil. I told pt that if he picked up the Lamisil at a [facility] he would only have to pay $4.00. Pt chose [id number]. Orders sent to [place].

## 2016-12-06 NOTE — Progress Notes (Signed)
He presents today for follow-up of his onychomycosis. He states that he did not start his Lamisil because he just recently had his liver profile performed by his primary doctor. He would like to consider laser therapy as an adjunct as well.  Objective: Vital signs are stable alert and oriented 3. Pulses are palpable. Neurologic sensorium is intact. Obviously no changes in nail plate.  Assessment: Onychomycosis.  Plan: We will start laser therapy immediately. I encouraged him to start his first 30 days of Lamisil. Should he have no problems with this then I recommended that he have another liver profile performed which we provided him with a requisition form today. I also provided him with another 90 days of Lamisil. Should his blood work him back abnormal I'll notify him immediately. I have requested that he send this blood work today from his primary doctor and he states that he will do so. I will follow-up with him in 3-4 months. He will see Marylou Mccoy for laser therapy immediately.

## 2016-12-13 ENCOUNTER — Other Ambulatory Visit: Payer: 59

## 2016-12-14 ENCOUNTER — Ambulatory Visit: Payer: 59

## 2016-12-14 DIAGNOSIS — L603 Nail dystrophy: Secondary | ICD-10-CM

## 2016-12-14 DIAGNOSIS — M79676 Pain in unspecified toe(s): Secondary | ICD-10-CM

## 2016-12-19 NOTE — Progress Notes (Signed)
Pt presents with mycotic infection of nails 1-5 bilateral  All other systems are negative  Laser therapy administered to affected nails and tolerated well. All safety precautions were in place. Re-appointed in 4 weeks for 2nd treatment 

## 2017-01-12 ENCOUNTER — Ambulatory Visit: Payer: 59 | Admitting: Podiatry

## 2017-01-12 DIAGNOSIS — L603 Nail dystrophy: Secondary | ICD-10-CM

## 2017-01-17 NOTE — Progress Notes (Signed)
Pt presents with mycotic infection of nails 1-5 bilateral  All other systems are negative  Laser therapy administered to affected nails and tolerated well. All safety precautions were in place. Re-appointed in 4 weeks for 3rd treatment 

## 2017-02-13 ENCOUNTER — Ambulatory Visit: Payer: Self-pay | Admitting: Podiatry

## 2017-02-13 DIAGNOSIS — B351 Tinea unguium: Secondary | ICD-10-CM

## 2017-02-13 DIAGNOSIS — L603 Nail dystrophy: Secondary | ICD-10-CM

## 2017-02-15 MED ORDER — TERBINAFINE HCL 250 MG PO TABS: 250.0000 mg | ORAL_TABLET | Freq: Every day | ORAL | 0 refills | Status: DC

## 2017-02-15 NOTE — Progress Notes (Signed)
Pt presents with mycotic infection of nails 1-5 bilateral  All other systems are negative  Laser therapy administered to affected nails and tolerated well. All safety precautions were in place. Re-appointed in 4 weeks for 4th treatment 

## 2017-03-08 ENCOUNTER — Ambulatory Visit: Payer: 59 | Admitting: Podiatry

## 2017-03-08 DIAGNOSIS — L603 Nail dystrophy: Secondary | ICD-10-CM

## 2017-03-08 DIAGNOSIS — B351 Tinea unguium: Secondary | ICD-10-CM

## 2017-03-08 NOTE — Progress Notes (Signed)
Pt presents with mycotic infection of nails 1-5 bilateral   All other systems are negative  Laser therapy administered to affected nails and tolerated well. All safety precautions were in place. Re-appointed in 4 weeks for 5th treatment 

## 2017-03-16 ENCOUNTER — Ambulatory Visit: Payer: 59

## 2017-04-04 ENCOUNTER — Encounter: Payer: Self-pay | Admitting: Podiatry

## 2017-04-04 ENCOUNTER — Ambulatory Visit (INDEPENDENT_AMBULATORY_CARE_PROVIDER_SITE_OTHER): Payer: 59 | Admitting: Podiatry

## 2017-04-04 DIAGNOSIS — Z79899 Other long term (current) drug therapy: Secondary | ICD-10-CM | POA: Diagnosis not present

## 2017-04-04 MED ORDER — TERBINAFINE HCL 250 MG PO TABS: 250.0000 mg | ORAL_TABLET | Freq: Every day | ORAL | 0 refills | Status: DC

## 2017-04-04 NOTE — Progress Notes (Signed)
Jesse Newton presents today for follow-up of his onychomycosis. He states that he is doing quite well with this and has had no problems taking medication. There is no erythema edema cellulitis drainage or odor.  Objective: Vital signs are stable he is alert and oriented 3. Toenails are nearly 80% clear.  Assessment: Onychomycosis resolving.  Plan: Started him on an every other day Lamisil tablets. He will continue taking this for the next 2 months. I will follow-up with him in 3 months.

## 2017-04-12 ENCOUNTER — Other Ambulatory Visit: Payer: 59

## 2017-04-18 ENCOUNTER — Ambulatory Visit: Payer: 59 | Admitting: Podiatry

## 2017-04-18 DIAGNOSIS — M722 Plantar fascial fibromatosis: Secondary | ICD-10-CM

## 2017-04-18 DIAGNOSIS — L603 Nail dystrophy: Secondary | ICD-10-CM

## 2017-04-24 NOTE — Progress Notes (Signed)
Pt presents with mycotic infection of nails 1-5 bilateral  All other systems are negative  Laser therapy administered to affected nails and tolerated well. All safety precautions were in place. Re-appointed in 4 weeks for 6th treatment 

## 2017-05-11 ENCOUNTER — Telehealth: Payer: Self-pay | Admitting: *Deleted

## 2017-05-11 MED ORDER — TERBINAFINE HCL 250 MG PO TABS: ORAL_TABLET | ORAL | 0 refills | Status: DC

## 2017-05-11 NOTE — Telephone Encounter (Signed)
Okay to refill-should have taken every other day to start with but go ahead and refill another #30 and instruct pt to only take every other day-keep scheduled follow up

## 2017-05-11 NOTE — Telephone Encounter (Addendum)
Received refill request for lamisil 250mg  #30. I informed pt of Dr. Stephenie Acres orders.

## 2017-05-18 ENCOUNTER — Ambulatory Visit (INDEPENDENT_AMBULATORY_CARE_PROVIDER_SITE_OTHER): Payer: 59 | Admitting: Podiatry

## 2017-05-18 DIAGNOSIS — L603 Nail dystrophy: Secondary | ICD-10-CM

## 2017-05-18 DIAGNOSIS — B351 Tinea unguium: Secondary | ICD-10-CM

## 2017-05-18 DIAGNOSIS — Z79899 Other long term (current) drug therapy: Secondary | ICD-10-CM

## 2017-05-18 NOTE — Progress Notes (Signed)
Pt presents with mycotic infection of nails 1-5 bilateral  All other systems are negative  Laser therapy administered to affected nails and tolerated well. All safety precautions were in place. Re-appointed prn

## 2017-08-08 ENCOUNTER — Ambulatory Visit: Payer: 59 | Admitting: Podiatry

## 2019-11-27 ENCOUNTER — Encounter: Payer: Self-pay | Admitting: Physical Therapy

## 2019-11-27 ENCOUNTER — Other Ambulatory Visit: Payer: Self-pay

## 2019-11-27 ENCOUNTER — Ambulatory Visit: Payer: 59 | Attending: Internal Medicine | Admitting: Physical Therapy

## 2019-11-27 DIAGNOSIS — M25612 Stiffness of left shoulder, not elsewhere classified: Secondary | ICD-10-CM | POA: Diagnosis present

## 2019-11-27 DIAGNOSIS — G8929 Other chronic pain: Secondary | ICD-10-CM | POA: Diagnosis present

## 2019-11-27 DIAGNOSIS — M25512 Pain in left shoulder: Secondary | ICD-10-CM | POA: Insufficient documentation

## 2019-11-27 DIAGNOSIS — M6281 Muscle weakness (generalized): Secondary | ICD-10-CM | POA: Insufficient documentation

## 2019-11-27 NOTE — Therapy (Signed)
[facility] [address], [place], [place], [address] Phone: [phone]   Fax:  [phone]  Physical Therapy Evaluation  Patient Details  Name: Jesse Newton MRN: [id number] Date of Birth: 1971-06-10 Referring Provider (PT): Dr. Jilda Panda   Encounter Date: 11/27/2019  PT End of Session - 11/27/19 1146    Visit Number  1    Date for PT Re-Evaluation  01/22/20    Authorization Type  [employer]    PT Start Time  1100    PT Stop Time  1140    PT Time Calculation (min)  40 min    Activity Tolerance  Patient tolerated treatment well;Patient limited by pain    Behavior During Therapy  [facility] for tasks assessed/performed       Past Medical History:  Diagnosis Date  . HOH (hard of hearing)   . 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 Soft palate injury    from radiation-surgery  . Wears hearing aid    both    Past Surgical History:  Procedure Laterality Date  . GASTROSTOMY W/ FEEDING TUBE  2007  . INGUINAL HERNIA REPAIR Bilateral 10/23/2014   Procedure: LAPAROSCOPIC INGUINAL HERNIA;  Surgeon: Coralie Keens, MD;  Location: [facility];  Service: General;  Laterality: Bilateral;  . INSERTION OF MESH Bilateral 10/23/2014   Procedure: INSERTION OF MESH;  Surgeon: Coralie Keens, MD;  Location: [facility];  Service: General;  Laterality: Bilateral;  . LARYNGOSCOPY    . NASOPHARYNGEAL BIOPSY    . PORT-A-CATH REMOVAL  2007   insertion and    There were no vitals filed for this visit.   Subjective Assessment - 11/27/19 1106    Subjective  Left shoulder has hurt for 3 years and progressively worse. Lift left arm out to the side or overhead hurts.    Currently in Pain?  Yes    Pain Score  4     Pain Location  Shoulder    Pain Orientation  Left    Pain Descriptors / Indicators  Sharp;Shooting    Pain Type  Chronic pain    Pain Onset  More than a month ago    Pain Frequency  Intermittent    Aggravating Factors   lift arm to the side, lift overhead, laying on left side, pulling seat belt on,    Pain Relieving Factors  arm at side    Multiple Pain Sites  No         [facility] PT Assessment - 11/27/19 0001      Assessment   Medical Diagnosis  Left shoulder pain    Referring Provider (PT)  Dr. Jilda Panda    Onset Date/Surgical Date  11/26/16    Prior Therapy  none      Precautions   Precautions  Other (comment)    Precaution Comments  cancer      Restrictions   Weight Bearing Restrictions  No      Balance Screen   Has the patient fallen in the past 6 months  No    Has the patient had a decrease in activity level because of a fear of falling?   No    Is the patient reluctant to leave their home because of a fear of falling?   No      Home Film/video editor residence      Prior Function   Level of Independence  Independent  Vocation  Full time employment    Vocation Requirements  sitting at the computer    Leisure  no exercise      Cognition   Overall Cognitive Status  Within Functional Limits for tasks assessed      Posture/Postural Control   Posture/Postural Control  No significant limitations      ROM / Strength   AROM / PROM / Strength  AROM;PROM;Strength      AROM   Left Shoulder Flexion  75 Degrees    Left Shoulder ABduction  75 Degrees   hikes shoulder up   Cervical Flexion  45    Cervical Extension  45    Cervical - Right Side Bend  15    Cervical - Left Side Bend  20    Cervical - Right Rotation  50    Cervical - Left Rotation  30      PROM   Left Shoulder Flexion  108 Degrees    Left Shoulder ABduction  90 Degrees    Left Shoulder Internal Rotation  5 Degrees   at 90 degrees abduction   Left Shoulder External Rotation  70 Degrees   at 90 degrees abduction     Strength   Overall Strength Comments  not tested today due to pain for shoulder; weak scapula muscles      Palpation   Palpation comment  pinpoint  tenderness located on the left RTC insertion                Objective measurements completed on examination: See above findings.      [facility] Adult PT Treatment/Exercise - 11/27/19 0001      Shoulder Exercises: Supine   External Rotation  PROM;Left;10 reps    External Rotation Limitations  using cane and elbow at his side    Flexion  PROM;Left;10 reps    Flexion Limitations  using a cane    Other Supine Exercises  tried standing pendulums but hurt his shoulder too much    Other Supine Exercises  supine scapula retraction holding 5 seconds 5 times             PT Education - 11/27/19 1145    Education Details  Access Code: [email or web address]    Person(s) Educated  Patient    Methods  Explanation;Demonstration;Verbal cues;Handout    Comprehension  Verbalized understanding;Returned demonstration       PT Short Term Goals - 11/27/19 1152      PT SHORT TERM GOAL #1   Title  independent with intial HEP    Time  4    Period  Weeks    Status  New    Target Date  12/25/19      PT SHORT TERM GOAL #2   Title  left shoulder flexion and abduction A/ROM >/= 120 degrees    Time  4    Period  Weeks    Status  New    Target Date  12/25/19      PT SHORT TERM GOAL #3   Title  left shoulder pain decreased >/= 25% due to improve mobility    Time  4    Period  Weeks    Status  New    Target Date  12/25/19      PT SHORT TERM GOAL #4   Title  able to assess left shoulder strength due to reduction of pain    Time  4    Period  Weeks    Status  New    Target Date  12/25/19        PT Long Term Goals - 11/27/19 1154      PT LONG TERM GOAL #1   Title  independent with advanced HEP    Time  8    Period  Weeks    Status  New    Target Date  01/22/20      PT LONG TERM GOAL #2   Title  left shoulder A/ROM >/= 140 degrees for flexion and abduction so he is able to reach in an overhead cabinet without difficulty    Time  8    Period  Weeks    Status  New    Target Date   01/22/20      PT LONG TERM GOAL #3   Title  able to carry an item at chest height due to left shoulder strength >/= 4/5 and reduction in pain    Time  8    Period  Weeks    Status  New    Target Date  01/22/20      PT LONG TERM GOAL #4   Title  able to dress himself without difficulty due to left shoulder pain decreased >/= 75%    Time  8    Period  Weeks    Status  New    Target Date  01/22/20             Plan - 11/27/19 1146    Clinical Impression Statement  Pateint is a 49 year old male with chronic left shoulder pain for the past 3 years that is getting progressively worse. Patient reports his pain is intermittent a level 6/10 with raising his arm overhead or to shoulder height. Left shoulder A/ROM and P/ROM is limited by 50%. Patient left shoulder strength was not assessed due to pain. Patient will hike up his left shoulder when raising his arm. Patient has weakness in the left scapula. Patient has palpable tenderness located on the left RTC insertion. Patient has limited cervical ROM. Patient will benefit from skilled therapy to reduce his pain and improve left shoulder ROM and function.    Personal Factors and Comorbidities  Comorbidity 2    Comorbidities  Diabetes; nasopharyngeal cancer with radiation and chemotherapy    Examination-Activity Limitations  Transfers;Bathing;Reach Overhead;Carry;Lift;Hygiene/Grooming;Dressing    Examination-Participation Restrictions  Meal Prep;Cleaning;Driving;Laundry    Stability/Clinical Decision Making  Evolving/Moderate complexity    Clinical Decision Making  Moderate    Rehab Potential  Excellent    PT Frequency  1x / week    PT Duration  8 weeks    PT Treatment/Interventions  Electrical Stimulation;Iontophoresis 4mg /ml Dexamethasone;Neuromuscular re-education;Therapeutic exercise;Therapeutic activities;Patient/family education;Manual techniques;Dry needling;Passive range of motion;Joint Manipulations;Taping    PT Next Visit Plan  ionto  if MD signed the note; dry needling to left shoulder; joint mobilization to left shoulder, scapula strengthening; work on HEP due to wanting to come 1 time per week due to high deductable    PT Home Exercise Plan  Access Code: [email or web address]    Consulted and Agree with Plan of Care  Patient       Patient will benefit from skilled therapeutic intervention in order to improve the following deficits and impairments:  Decreased range of motion, Increased fascial restricitons, Pain, Decreased activity tolerance, Decreased mobility, Decreased strength  Visit Diagnosis: Muscle weakness (generalized) - Plan: PT plan of care cert/re-cert  Chronic left shoulder pain - Plan: PT plan of care cert/re-cert  Stiffness  of left shoulder, not elsewhere classified - Plan: PT plan of care cert/re-cert     Problem List Patient Active Problem List   Diagnosis Date Noted  . Nasopharyngeal carcinoma ([place]) 12/02/2013  . 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, [place], [place], [address] Phone: [phone]   Fax:  [phone]  Name: Jesse Newton MRN: [id number] Date of Birth: 01/10/1971

## 2019-11-27 NOTE — Patient Instructions (Signed)
Access Code: [email or web address] URL: [email or web address] Date: 11/27/2019 Prepared by: Earlie Counts  Exercises Supine Scapular Retraction - 1 x daily - 7 x weekly - 1 sets - 5 reps - 5 hold Supine Shoulder Flexion Extension AAROM with Dowel - 2 x daily - 7 x weekly - 1 sets - 10 reps Supine Shoulder External Rotation AAROM with Dowel - 2 x daily - 7 x weekly - 1 sets - 10 reps [facility] [address] [place], [place] [address] Phone # [phone] Fax [phone]

## 2019-12-03 ENCOUNTER — Other Ambulatory Visit: Payer: Self-pay

## 2019-12-03 ENCOUNTER — Ambulatory Visit: Payer: 59 | Attending: Internal Medicine | Admitting: Physical Therapy

## 2019-12-03 ENCOUNTER — Ambulatory Visit: Payer: 59 | Admitting: Physical Therapy

## 2019-12-03 DIAGNOSIS — M6281 Muscle weakness (generalized): Secondary | ICD-10-CM | POA: Diagnosis present

## 2019-12-03 DIAGNOSIS — M25512 Pain in left shoulder: Secondary | ICD-10-CM | POA: Insufficient documentation

## 2019-12-03 DIAGNOSIS — G8929 Other chronic pain: Secondary | ICD-10-CM | POA: Insufficient documentation

## 2019-12-03 DIAGNOSIS — M25612 Stiffness of left shoulder, not elsewhere classified: Secondary | ICD-10-CM | POA: Diagnosis present

## 2019-12-03 NOTE — Therapy (Signed)
[facility] [address], [place], [place], [address] Phone: [phone]   Fax:  [phone]  Physical Therapy Treatment  Patient Details  Name: Jesse Newton MRN: [id number] Date of Birth: 11-Jul-1971 Referring Provider (PT): Dr. Jilda Panda   Encounter Date: 12/03/2019  PT End of Session - 12/03/19 2027    Visit Number  2    Date for PT Re-Evaluation  01/22/20    Authorization Type  UHC    PT Start Time  1147    PT Stop Time  1230    PT Time Calculation (min)  43 min    Activity Tolerance  Patient limited by pain       Past Medical History:  Diagnosis Date  . HOH (hard of hearing)   . 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 Soft palate injury    from radiation-surgery  . Wears hearing aid    both    Past Surgical History:  Procedure Laterality Date  . GASTROSTOMY W/ FEEDING TUBE  2007  . INGUINAL HERNIA REPAIR Bilateral 10/23/2014   Procedure: LAPAROSCOPIC INGUINAL HERNIA;  Surgeon: Coralie Keens, MD;  Location: [facility];  Service: General;  Laterality: Bilateral;  . INSERTION OF MESH Bilateral 10/23/2014   Procedure: INSERTION OF MESH;  Surgeon: Coralie Keens, MD;  Location: [facility];  Service: General;  Laterality: Bilateral;  . LARYNGOSCOPY    . NASOPHARYNGEAL BIOPSY    . PORT-A-CATH REMOVAL  2007   insertion and    There were no vitals filed for this visit.  Subjective Assessment - 12/03/19 1146    Subjective  It hurts more today than last time.  Raising my arm up 7-8/10.  No pain at rest.    Currently in Pain?  Yes    Pain Score  4     Pain Location  Shoulder                       [facility] Adult PT Treatment/Exercise - 12/03/19 0001      Shoulder Exercises: Supine   Flexion  PROM;Left;5 reps   hold 1 minute at point of "stretch" but not pain    Flexion Limitations  modified to clasped hands     Other Supine Exercises  supine scapula retraction  holding 5 seconds 5 times   discussed importance of postural alignment and effect      Shoulder Exercises: Standing   Other Standing Exercises  self inferior capsule stretch using other hand to distract or 5# weight to distract     Other Standing Exercises  attempted but discontinued posterior capsule stretch secondary to pain      Moist Heat Therapy   Number Minutes Moist Heat  5 Minutes   concurrent with manual therapy and during supine self ROM    Moist Heat Location  Shoulder      Iontophoresis   Type of Iontophoresis  Dexamethasone    Location  left superior/lateral shoulder    Dose  4 mg/ml     Time  4-6 hour patch       Manual Therapy   Joint Mobilization  glenohumeral joint distraction 5x 10 sec holds;  inferior and posterior mobs grade 1/2 5x each     Kinesiotex  Facilitate Muscle      Kinesiotix   Facilitate Muscle   3 strips over deltoid for support  PT Education - 12/03/19 2026    Education Details  Access Code: [email or web address]  supine clasped hands flexion static holds 2 min every 2 hours;  standing inferior capsule stretch using other hand or 5# weight;  ionto info    Person(s) Educated  Patient    Methods  Explanation;Demonstration;Handout    Comprehension  Returned demonstration;Verbalized understanding       PT Short Term Goals - 11/27/19 1152      PT SHORT TERM GOAL #1   Title  independent with intial HEP    Time  4    Period  Weeks    Status  New    Target Date  12/25/19      PT SHORT TERM GOAL #2   Title  left shoulder flexion and abduction A/ROM >/= 120 degrees    Time  4    Period  Weeks    Status  New    Target Date  12/25/19      PT SHORT TERM GOAL #3   Title  left shoulder pain decreased >/= 25% due to improve mobility    Time  4    Period  Weeks    Status  New    Target Date  12/25/19      PT SHORT TERM GOAL #4   Title  able to assess left shoulder strength due to reduction of pain    Time  4    Period  Weeks    Status   New    Target Date  12/25/19        PT Long Term Goals - 11/27/19 1154      PT LONG TERM GOAL #1   Title  independent with advanced HEP    Time  8    Period  Weeks    Status  New    Target Date  01/22/20      PT LONG TERM GOAL #2   Title  left shoulder A/ROM >/= 140 degrees for flexion and abduction so he is able to reach in an overhead cabinet without difficulty    Time  8    Period  Weeks    Status  New    Target Date  01/22/20      PT LONG TERM GOAL #3   Title  able to carry an item at chest height due to left shoulder strength >/= 4/5 and reduction in pain    Time  8    Period  Weeks    Status  New    Target Date  01/22/20      PT LONG TERM GOAL #4   Title  able to dress himself without difficulty due to left shoulder pain decreased >/= 75%    Time  8    Period  Weeks    Status  New    Target Date  01/22/20            Plan - 12/03/19 2027    Clinical Impression Statement  The patient reports continued high pain intensity and difficulty performing initial HEPs.  Modified technique and encouraged low load, longer duration, low repetition with flexion ROM in supine.  Added inferior capsule stretch which he was able to perform without discomfort.  Initiated iontophoresis and kinesiotape for pain control.  Therapist providing verbal cues to decrease compensatory shoulder hike.    Comorbidities  Diabetes; nasopharyngeal cancer with radiation and chemotherapy    Examination-Participation Restrictions  Meal Prep;Cleaning;Driving;Laundry    Rehab Potential  Excellent    PT Frequency  1x / week    PT Duration  8 weeks    PT Treatment/Interventions  Electrical Stimulation;Iontophoresis 4mg /ml Dexamethasone;Neuromuscular re-education;Therapeutic exercise;Therapeutic activities;Patient/family education;Manual techniques;Dry needling;Passive range of motion;Joint Manipulations;Taping    PT Next Visit Plan  ionto #2; KT; moist heat with low load long duration stretching;  dry  needling to left shoulder; joint mobilization to left shoulder, scapula strengthening; work on HEP due to wanting to come 1 time per week due to high deductable    PT Home Exercise Plan  Access Code: [facility]       Patient will benefit from skilled therapeutic intervention in order to improve the following deficits and impairments:  Decreased range of motion, Increased fascial restricitons, Pain, Decreased activity tolerance, Decreased mobility, Decreased strength  Visit Diagnosis: Muscle weakness (generalized)  Chronic left shoulder pain  Stiffness of left shoulder, not elsewhere classified     Problem List Patient Active Problem List   Diagnosis Date Noted  . Nasopharyngeal carcinoma ([place]) 12/02/2013  . 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, [place], [place], [address] Phone: [phone]   Fax:  [phone]  Name: Jesse Newton MRN: [id number] Date of Birth: September 25, 1970

## 2019-12-03 NOTE — Patient Instructions (Signed)
Access Code: [email or web address] URL: [email or web address] Date: 12/03/2019 Prepared by: Ruben Im  Exercises Supine Scapular Retraction - 1 x daily - 7 x weekly - 1 sets - 5 reps - 5 hold Supine Shoulder External Rotation AAROM with Dowel - 2 x daily - 7 x weekly - 1 sets - 10 reps Supine Shoulder Flexion AAROM with Hands Clasped - 6 x daily - 7 x weekly - 1 sets - 12 reps - 60 hold Flexion-Extension Shoulder Pendulum with Table Support - 1 x daily - 7 x weekly - 1 sets - 10 reps IONTOPHORESIS PATIENT PRECAUTIONS & CONTRAINDICATIONS:  . Redness under one or both electrodes can occur.  This characterized by a uniform redness that usually disappears within 12 hours of treatment. . Small pinhead size blisters may result in response to the drug.  Contact your physician if the problem persists more than 24 hours. . On rare occasions, iontophoresis therapy can result in temporary skin reactions such as rash, inflammation, irritation or burns.  The skin reactions may be the result of individual sensitivity to the ionic solution used, the condition of the skin at the start of treatment, reaction to the materials in the electrodes, allergies or sensitivity to dexamethasone, or a poor connection between the patch and your skin.  Discontinue using iontophoresis if you have any of these reactions and report to your therapist. . Remove the Patch or electrodes if you have any undue sensation of pain or burning during the treatment and report discomfort to your therapist. . Tell your Therapist if you have had known adverse reactions to the application of electrical current. . If using the Patch, the LED light will turn off when treatment is complete and the patch can be removed.  Approximate treatment time is 1-3 hours.  Remove the patch when light goes off or after 6 hours. . The Patch can be worn during normal activity, however excessive motion where the electrodes have been placed can cause poor  contact between the skin and the electrode or uneven electrical current resulting in greater risk of skin irritation. Marland Kitchen Keep out of the reach of children.   . DO NOT use if you have a cardiac pacemaker or any other electrically sensitive implanted device. . DO NOT use if you have a known sensitivity to dexamethasone. . DO NOT use during Magnetic Resonance Imaging (MRI). . DO NOT use over broken or compromised skin (e.g. sunburn, cuts, or acne) due to the increased risk of skin reaction. . DO NOT SHAVE over the area to be treated:  To establish good contact between the Patch and the skin, excessive hair may be clipped. . DO NOT place the Patch or electrodes on or over your eyes, directly over your heart, or brain. . DO NOT reuse the Patch or electrodes as this may cause burns to occur.

## 2019-12-13 ENCOUNTER — Other Ambulatory Visit: Payer: Self-pay

## 2019-12-13 ENCOUNTER — Ambulatory Visit: Payer: 59 | Admitting: Physical Therapy

## 2019-12-13 DIAGNOSIS — G8929 Other chronic pain: Secondary | ICD-10-CM

## 2019-12-13 DIAGNOSIS — M25612 Stiffness of left shoulder, not elsewhere classified: Secondary | ICD-10-CM

## 2019-12-13 DIAGNOSIS — M6281 Muscle weakness (generalized): Secondary | ICD-10-CM

## 2019-12-13 DIAGNOSIS — M25512 Pain in left shoulder: Secondary | ICD-10-CM

## 2019-12-13 NOTE — Patient Instructions (Signed)
     Trigger Point Dry Needling  . What is Trigger Point Dry Needling (DN)? o DN is a physical therapy technique used to treat muscle pain and dysfunction. Specifically, DN helps deactivate muscle trigger points (muscle knots).  o A thin filiform needle is used to penetrate the skin and stimulate the underlying trigger point. The goal is for a local twitch response (LTR) to occur and for the trigger point to relax. No medication of any kind is injected during the procedure.   . What Does Trigger Point Dry Needling Feel Like?  o The procedure feels different for each individual patient. Some patients report that they do not actually feel the needle enter the skin and overall the process is not painful. Very mild bleeding may occur. However, many patients feel a deep cramping in the muscle in which the needle was inserted. This is the local twitch response.   . How Will I feel after the treatment? o Soreness is normal, and the onset of soreness may not occur for a few hours. Typically this soreness does not last longer than two days.  o Bruising is uncommon, however; ice can be used to decrease any possible bruising.  o In rare cases feeling tired or nauseous after the treatment is normal. In addition, your symptoms may get worse before they get better, this period will typically not last longer than 24 hours.   . What Can I do After My Treatment? o Increase your hydration by drinking more water for the next 24 hours. o You may place ice or heat on the areas treated that have become sore, however, do not use heat on inflamed or bruised areas. Heat often brings more relief post needling. o You can continue your regular activities, but vigorous activity is not recommended initially after the treatment for 24 hours. o DN is best combined with other physical therapy such as strengthening, stretching, and other therapies.    Deyton Ellenbecker PT [facility] [address], [address] [place], [place] [address] Phone # [phone] Fax [phone] 

## 2019-12-13 NOTE — Therapy (Signed)
[facility] [address], [place], [place], [address] Phone: [phone]   Fax:  [phone]  Physical Therapy Treatment  Patient Details  Name: Jesse Newton MRN: [id number] Date of Birth: 09-Jun-1971 Referring Provider (PT): Dr. Jilda Panda   Encounter Date: 12/13/2019  PT End of Session - 12/13/19 1156    Visit Number  3    Date for PT Re-Evaluation  01/22/20    Authorization Type  U[employer]    PT Start Time  1105    PT Stop Time  1150    PT Time Calculation (min)  45 min    Activity Tolerance  Patient tolerated treatment well       Past Medical History:  Diagnosis Date  . HOH (hard of hearing)   . 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 Soft palate injury    from radiation-surgery  . Wears hearing aid    both    Past Surgical History:  Procedure Laterality Date  . GASTROSTOMY W/ FEEDING TUBE  2007  . INGUINAL HERNIA REPAIR Bilateral 10/23/2014   Procedure: LAPAROSCOPIC INGUINAL HERNIA;  Surgeon: Coralie Keens, MD;  Location: [facility];  Service: General;  Laterality: Bilateral;  . INSERTION OF MESH Bilateral 10/23/2014   Procedure: INSERTION OF MESH;  Surgeon: Coralie Keens, MD;  Location: [facility];  Service: General;  Laterality: Bilateral;  . LARYNGOSCOPY    . NASOPHARYNGEAL BIOPSY    . PORT-A-CATH REMOVAL  2007   insertion and    There were no vitals filed for this visit.  Subjective Assessment - 12/13/19 1205    Subjective  My shoulder feels a lot better!  I can move it out to the side much higher now.  That (ionto) patch and the (kinesio) tape really helped.    Currently in Pain?  Yes    Pain Score  2     Pain Location  Shoulder    Pain Orientation  Left         [facility] PT Assessment - 12/13/19 0001      AROM   Left Shoulder Flexion  110 Degrees    Left Shoulder ABduction  143 Degrees    Cervical - Right Side Bend  30    Cervical - Left Side Bend  28    Cervical - Right Rotation  50    Cervical - Left Rotation  40                   [facility] Adult PT Treatment/Exercise - 12/13/19 0001      Moist Heat Therapy   Number Minutes Moist Heat  5 Minutes   following DN and concurrent with manual therapy   Moist Heat Location  Shoulder      Iontophoresis   Type of Iontophoresis  Dexamethasone    Location  left superior/lateral shoulder    Dose  4 mg/ml     Time  4-6 hour patch       Manual Therapy   Manual therapy comments  scapular mobs medial and lateral glides    Joint Mobilization  glenohumeral joint distraction 5x 10 sec holds; passive flexion and abduction 3x each     Kinesiotex  Facilitate Muscle      Kinesiotix   Facilitate Muscle   3 strips over deltoid for support        Trigger Point Dry Needling - 12/13/19 0001    Consent Given?  Yes  Education Handout Provided  Yes    Muscles Treated Head and Neck  Upper trapezius;Levator scapulae    Muscles Treated Upper Quadrant  Supraspinatus;Infraspinatus    Other Dry Needling  left only     Upper Trapezius Response  Palpable increased muscle length    Levator Scapulae Response  Palpable increased muscle length    Supraspinatus Response  Palpable increased muscle length    Infraspinatus Response  Palpable increased muscle length           PT Education - 12/13/19 1130    Education Details  dry needling after care    Person(s) Educated  Patient    Methods  Explanation;Demonstration;Handout    Comprehension  Returned demonstration;Verbalized understanding       PT Short Term Goals - 11/27/19 1152      PT SHORT TERM GOAL #1   Title  independent with intial HEP    Time  4    Period  Weeks    Status  New    Target Date  12/25/19      PT SHORT TERM GOAL #2   Title  left shoulder flexion and abduction A/ROM >/= 120 degrees    Time  4    Period  Weeks    Status  New    Target Date  12/25/19      PT SHORT TERM GOAL #3   Title  left shoulder pain decreased  >/= 25% due to improve mobility    Time  4    Period  Weeks    Status  New    Target Date  12/25/19      PT SHORT TERM GOAL #4   Title  able to assess left shoulder strength due to reduction of pain    Time  4    Period  Weeks    Status  New    Target Date  12/25/19        PT Long Term Goals - 11/27/19 1154      PT LONG TERM GOAL #1   Title  independent with advanced HEP    Time  8    Period  Weeks    Status  New    Target Date  01/22/20      PT LONG TERM GOAL #2   Title  left shoulder A/ROM >/= 140 degrees for flexion and abduction so he is able to reach in an overhead cabinet without difficulty    Time  8    Period  Weeks    Status  New    Target Date  01/22/20      PT LONG TERM GOAL #3   Title  able to carry an item at chest height due to left shoulder strength >/= 4/5 and reduction in pain    Time  8    Period  Weeks    Status  New    Target Date  01/22/20      PT LONG TERM GOAL #4   Title  able to dress himself without difficulty due to left shoulder pain decreased >/= 75%    Time  8    Period  Weeks    Status  New    Target Date  01/22/20            Plan - 12/13/19 1158    Clinical Impression Statement  The patient has significantly improved  shoulder flexion and abduction ROM with less pain.  He reports a very positive response  to ionto and kinesiotape last visit and demonstrates regular compliance with self stretching.  Much improved soft tissue mobility and decreased tender points following dry needling.  Therapist monitoring response with all interventions.    Comorbidities  Diabetes; nasopharyngeal cancer with radiation and chemotherapy    Examination-Activity Limitations  Transfers;Bathing;Reach Overhead;Carry;Lift;Hygiene/Grooming;Dressing    Rehab Potential  Excellent    PT Frequency  1x / week    PT Duration  8 weeks    PT Treatment/Interventions  Electrical Stimulation;Iontophoresis 4mg /ml Dexamethasone;Neuromuscular re-education;Therapeutic  exercise;Therapeutic activities;Patient/family education;Manual techniques;Dry needling;Passive range of motion;Joint Manipulations;Taping    PT Next Visit Plan  assess response to DN #1;  continue ionto patch #3; KT tape deltoid unloading;  now that pain level has decreased, he may be ready to add to HEP for strengthening    PT Home Exercise Plan  Access Code: [email or web address]       Patient will benefit from skilled therapeutic intervention in order to improve the following deficits and impairments:  Decreased range of motion, Increased fascial restricitons, Pain, Decreased activity tolerance, Decreased mobility, Decreased strength  Visit Diagnosis: Muscle weakness (generalized)  Chronic left shoulder pain  Stiffness of left shoulder, not elsewhere classified     Problem List Patient Active Problem List   Diagnosis Date Noted  . Nasopharyngeal carcinoma ([place]) 12/02/2013  . 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 [place], [place], [address] Phone: [phone]   Fax:  [phone]  Name: Jesse Newton MRN: [id number] Date of Birth: 1971/06/22

## 2019-12-18 ENCOUNTER — Ambulatory Visit: Payer: 59 | Admitting: Physical Therapy

## 2019-12-18 ENCOUNTER — Encounter: Payer: Self-pay | Admitting: Physical Therapy

## 2019-12-18 ENCOUNTER — Other Ambulatory Visit: Payer: Self-pay

## 2019-12-18 DIAGNOSIS — M6281 Muscle weakness (generalized): Secondary | ICD-10-CM

## 2019-12-18 DIAGNOSIS — M25612 Stiffness of left shoulder, not elsewhere classified: Secondary | ICD-10-CM

## 2019-12-18 DIAGNOSIS — G8929 Other chronic pain: Secondary | ICD-10-CM

## 2019-12-18 NOTE — Therapy (Signed)
[facility] [address] [place], [place], [address] Phone: [phone]   Fax:  [phone]  Physical Therapy Treatment  Patient Details  Name: HELGE DELZELL MRN: [id number] Date of Birth: [birth year] Referring Provider (PT): Dr. Jilda Panda   Encounter Date: 12/18/2019  PT End of Session - 12/18/19 1324    Visit Number  4    Date for PT Re-Evaluation  01/22/20    Authorization Type  UHC    PT Start Time  1230    PT Stop Time  1310    PT Time Calculation (min)  40 min    Activity Tolerance  Patient tolerated treatment well    Behavior During Therapy  [facility] for tasks assessed/performed       Past Medical History:  Diagnosis Date  . HOH (hard of hearing)   . 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 Soft palate injury    from radiation-surgery  . Wears hearing aid    both    Past Surgical History:  Procedure Laterality Date  . GASTROSTOMY W/ FEEDING TUBE  2007  . INGUINAL HERNIA REPAIR Bilateral 10/23/2014   Procedure: LAPAROSCOPIC INGUINAL HERNIA;  Surgeon: Coralie Keens, MD;  Location: [place] [facility];  Service: General;  Laterality: Bilateral;  . INSERTION OF MESH Bilateral 10/23/2014   Procedure: INSERTION OF MESH;  Surgeon: Coralie Keens, MD;  Location: [facility];  Service: General;  Laterality: Bilateral;  . LARYNGOSCOPY    . NASOPHARYNGEAL BIOPSY    . PORT-A-CATH REMOVAL  2007   insertion and    There were no vitals filed for this visit.  Subjective Assessment - 12/18/19 1234    Subjective  My shoulder is feeling better. I have the most trouble with forward flexion.    Patient Stated Goals  reduce pain    Currently in Pain?  Yes    Pain Score  5     Pain Location  Shoulder    Pain Orientation  Left    Pain Descriptors / Indicators  Sharp;Shooting    Pain Type  Chronic pain    Pain Onset  More than a month ago    Pain Frequency  Intermittent    Aggravating Factors    lift arm to the side, lift overhead, laying on left side, pulling seat belt on    Pain Relieving Factors  arm at side                       [facility] Adult PT Treatment/Exercise - 12/18/19 0001      Shoulder Exercises: Standing   External Rotation  Strengthening;Both;15 reps    External Rotation Limitations  arms at side    Flexion  Left;AAROM;10 reps    Flexion Limitations  sliding hand on wall, right hand on upper trap to let patient know when he elevates it      Manual Therapy   Manual Therapy  Joint mobilization;Soft tissue mobilization    Joint Mobilization  glenohumeral joint distraction, inferior and posterior glide, left first rib mob, left scapular mobiliztion, PA mobilzation to T1-T6    Soft tissue mobilization  left upper trap levator scapulae, pectoralis, borders of axilla, interscapular, RTC       Trigger Point Dry Needling - 12/18/19 0001    Consent Given?  Yes    Education Handout Provided  Previously provided    Muscles Treated Head and Neck  Upper trapezius;Levator scapulae  Muscles Treated Upper Quadrant  Longissimus;Teres major;Subscapularis    Muscles Treated Back/Hip  Thoracic multifidi   T1-T3 on left   Other Dry Needling  left only     Upper Trapezius Response  Palpable increased muscle length    Levator Scapulae Response  Palpable increased muscle length    Subscapularis Response  Twitch response elicited;Palpable increased muscle length    Longissimus Response  Twitch response elicited;Palpable increased muscle length    Teres major Response  Twitch response elicited;Palpable increased muscle length    Thoracic multifidi response  Twitch response elicited;Palpable increased muscle length           PT Education - 12/18/19 1319    Education Details  Access Code: [email or web address]    Person(s) Educated  Patient    Methods  Explanation;Demonstration;Verbal cues;Handout    Comprehension  Verbalized understanding;Returned demonstration       PT  Short Term Goals - 12/18/19 1324      PT SHORT TERM GOAL #1   Title  independent with intial HEP    Time  4    Period  Weeks    Status  Achieved      PT SHORT TERM GOAL #2   Title  left shoulder flexion and abduction A/ROM >/= 120 degrees    Time  4    Period  Weeks    Status  On-going      PT SHORT TERM GOAL #3   Title  left shoulder pain decreased >/= 25% due to improve mobility    Time  4    Period  Weeks    Status  Achieved      PT SHORT TERM GOAL #4   Title  able to assess left shoulder strength due to reduction of pain    Time  4    Period  Weeks    Status  On-going        PT Long Term Goals - 11/27/19 1154      PT LONG TERM GOAL #1   Title  independent with advanced HEP    Time  8    Period  Weeks    Status  New    Target Date  01/22/20      PT LONG TERM GOAL #2   Title  left shoulder A/ROM >/= 140 degrees for flexion and abduction so he is able to reach in an overhead cabinet without difficulty    Time  8    Period  Weeks    Status  New    Target Date  01/22/20      PT LONG TERM GOAL #3   Title  able to carry an item at chest height due to left shoulder strength >/= 4/5 and reduction in pain    Time  8    Period  Weeks    Status  New    Target Date  01/22/20      PT LONG TERM GOAL #4   Title  able to dress himself without difficulty due to left shoulder pain decreased >/= 75%    Time  8    Period  Weeks    Status  New    Target Date  01/22/20            Plan - 12/18/19 1306    Clinical Impression Statement  Patient has alot of trigger points along the interscapular area that cause movement at the left upper trap area. When patient does foward flexion of  left shoulder he will elevate and not move the scapula correctly. Patient was educated on his posture due to increased thoracic kyphosis. The ionto was not placed on the left shoulder due to no palpable pain in the RTC insertion. Kinesiotape was not done due to the lotion on his arm and would  not stick. Patient will benefit from skilled therapy to improve muscle education and reduce pain.    Personal Factors and Comorbidities  Comorbidity 2    Comorbidities  Diabetes; nasopharyngeal cancer with radiation and chemotherapy    Examination-Activity Limitations  Transfers;Bathing;Reach Overhead;Carry;Lift;Hygiene/Grooming;Dressing    Examination-Participation Restrictions  Meal Prep;Cleaning;Driving;Laundry    Stability/Clinical Decision Making  Evolving/Moderate complexity    Rehab Potential  Excellent    PT Frequency  1x / week    PT Duration  8 weeks    PT Treatment/Interventions  Electrical Stimulation;Iontophoresis 4mg /ml Dexamethasone;Neuromuscular re-education;Therapeutic exercise;Therapeutic activities;Patient/family education;Manual techniques;Dry needling;Passive range of motion;Joint Manipulations;Taping    PT Next Visit Plan  assess left shoulder strength; if palpable pain ionto; DN around the left shoulder blade; work on serratus anterior and lower trap, add shoulder ER at side with band, supine horizontal shoulder abduction    PT Home Exercise Plan  Access Code: [email or web address]    Recommended Other Services  MD signed initial note    Consulted and Agree with Plan of Care  Patient       Patient will benefit from skilled therapeutic intervention in order to improve the following deficits and impairments:  Decreased range of motion, Increased fascial restricitons, Pain, Decreased activity tolerance, Decreased mobility, Decreased strength  Visit Diagnosis: Muscle weakness (generalized)  Chronic left shoulder pain  Stiffness of left shoulder, not elsewhere classified     Problem List Patient Active Problem List   Diagnosis Date Noted  . Nasopharyngeal carcinoma ([place]) 12/02/2013  . 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 [place], [place], [address] Phone: [phone]   Fax:  [phone]  Name: MACKENZIE VALDIVIEZ MRN: [id number] Date of Birth: 1971/01/03

## 2019-12-18 NOTE — Patient Instructions (Signed)
Access Code: [email or web address] URL: [email or web address] Date: 12/18/2019 Prepared by: Earlie Counts  Exercises Supine Scapular Retraction - 1 x daily - 7 x weekly - 1 sets - 5 reps - 5 hold Supine Shoulder External Rotation AAROM with Dowel - 2 x daily - 7 x weekly - 1 sets - 10 reps Supine Shoulder Flexion AAROM with Hands Clasped - 6 x daily - 7 x weekly - 1 sets - 12 reps - 60 hold Flexion-Extension Shoulder Pendulum with Table Support - 1 x daily - 7 x weekly - 1 sets - 10 reps Seated Shoulder External Rotation - 3 x daily - 7 x weekly - 1 sets - 5 reps - 5 sec hold Shoulder Flexion Wall Slide with Towel - 3 x daily - 7 x weekly - 1 sets - 10 reps  [facility] [address] [place], [place] [address] Phone # [phone] Fax [phone]

## 2019-12-25 ENCOUNTER — Ambulatory Visit: Payer: 59 | Admitting: Physical Therapy

## 2019-12-25 ENCOUNTER — Encounter: Payer: Self-pay | Admitting: Physical Therapy

## 2019-12-25 ENCOUNTER — Other Ambulatory Visit: Payer: Self-pay

## 2019-12-25 DIAGNOSIS — M25612 Stiffness of left shoulder, not elsewhere classified: Secondary | ICD-10-CM

## 2019-12-25 DIAGNOSIS — G8929 Other chronic pain: Secondary | ICD-10-CM

## 2019-12-25 DIAGNOSIS — M6281 Muscle weakness (generalized): Secondary | ICD-10-CM | POA: Diagnosis not present

## 2019-12-25 NOTE — Therapy (Signed)
[facility] [address] [place], [place], [address] Phone: [phone]   Fax:  [phone]  Physical Therapy Treatment  Patient Details  Name: Jesse Newton MRN: [id number] Date of Birth: 1971/06/19 Referring Provider (PT): Dr. Jilda Panda   Encounter Date: 12/25/2019  PT End of Session - 12/25/19 1315    Visit Number  5    Date for PT Re-Evaluation  01/22/20    Authorization Type  UHC    PT Start Time  1230    PT Stop Time  1310    PT Time Calculation (min)  40 min    Activity Tolerance  Patient tolerated treatment well    Behavior During Therapy  [facility] for tasks assessed/performed       Past Medical History:  Diagnosis Date  . HOH (hard of hearing)   . 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 Soft palate injury    from radiation-surgery  . Wears hearing aid    both    Past Surgical History:  Procedure Laterality Date  . GASTROSTOMY W/ FEEDING TUBE  2007  . INGUINAL HERNIA REPAIR Bilateral 10/23/2014   Procedure: LAPAROSCOPIC INGUINAL HERNIA;  Surgeon: Coralie Keens, MD;  Location: [place];  Service: General;  Laterality: Bilateral;  . INSERTION OF MESH Bilateral 10/23/2014   Procedure: INSERTION OF MESH;  Surgeon: Coralie Keens, MD;  Location: [place];  Service: General;  Laterality: Bilateral;  . LARYNGOSCOPY    . NASOPHARYNGEAL BIOPSY    . PORT-A-CATH REMOVAL  2007   insertion and    There were no vitals filed for this visit.  Subjective Assessment - 12/25/19 1234    Subjective  Pain happens 75% of the time. When bring the left arm into abduction feels the pain. Feels the most pain with forward flexion.    Patient Stated Goals  reduce pain    Currently in Pain?  Yes    Pain Score  3     Pain Location  Shoulder    Pain Orientation  Left    Pain Descriptors / Indicators  Sharp    Pain Type  Chronic pain    Pain Onset  More than a month ago    Pain Frequency   Intermittent    Aggravating Factors   forward flexion and arm abduction         [facility] PT Assessment - 12/25/19 0001      Strength   Left Shoulder Flexion  3/5    Left Shoulder ABduction  3/5                   [facility] Adult PT Treatment/Exercise - 12/25/19 0001      Shoulder Exercises: Supine   Other Supine Exercises  supine with yellow band around the hands and pulling apart bringing arms up and down   no pain with scapula retracted     Shoulder Exercises: Seated   Retraction  Strengthening;Both;15 reps;Theraband    Theraband Level (Shoulder Retraction)  Level 2 (Red)    Retraction Limitations  elbows at side      Shoulder Exercises: Standing   Other Standing Exercises  tried serratus against wall with red band then yellow band but caused pain      Shoulder Exercises: ROM/Strengthening   Ranger  UE ranger on left with shoulder forward flexion, abduction, horizontal abduction, with therapist guiding the humeral head and assisting thoracic facet motion      Modalities  Modalities  Iontophoresis      Iontophoresis   Type of Iontophoresis  Dexamethasone    Location  left superior/lateral shoulder    Dose  4 mg/ml , #3    Time  4-6 hour patch       Manual Therapy   Manual Therapy  Joint mobilization    Joint Mobilization  side glide to left side of C3-& and T1-T5 to work on shoulder ROM             PT Education - 12/25/19 1312    Education Details  Access Code: [email or web address]: [email or web address]: 04/28/2021Prepared by: Malachy Mood GrayExercisesFlexion-Extension Shoulder Pendulum with Table Support - 1 x daily - 7 x weekly - 1 sets - 10 repsSeated Shoulder External Rotation - 1 x daily - 7 x weekly - 1 sets - 15 reps - 5 sec holdShoulder Flexion Wall Slide with Towel - 3 x daily - 7 x weekly - 1 sets - 10 repsProne Scapular Retraction - 1 x daily - 7 x weekly - 1 sets - 15 repsProne Shoulder Horizontal Abduction - 1 x daily - 7 x weekly - 1 sets -  15 reps    Person(s) Educated  Patient    Methods  Explanation;Demonstration;Verbal cues;Handout    Comprehension  Verbalized understanding;Returned demonstration       PT Short Term Goals - 12/25/19 1321      PT SHORT TERM GOAL #4   Title  able to assess left shoulder strength due to reduction of pain    Time  4    Period  Weeks    Status  Achieved        PT Long Term Goals - 11/27/19 1154      PT LONG TERM GOAL #1   Title  independent with advanced HEP    Time  8    Period  Weeks    Status  New    Target Date  01/22/20      PT LONG TERM GOAL #2   Title  left shoulder A/ROM >/= 140 degrees for flexion and abduction so he is able to reach in an overhead cabinet without difficulty    Time  8    Period  Weeks    Status  New    Target Date  01/22/20      PT LONG TERM GOAL #3   Title  able to carry an item at chest height due to left shoulder strength >/= 4/5 and reduction in pain    Time  8    Period  Weeks    Status  New    Target Date  01/22/20      PT LONG TERM GOAL #4   Title  able to dress himself without difficulty due to left shoulder pain decreased >/= 75%    Time  8    Period  Weeks    Status  New    Target Date  01/22/20            Plan - 12/25/19 1301    Clinical Impression Statement  Patient is having pain 25% of the time. Patient most pain is forward flexion in midrange. When patient rectracts the left scapula he had less pain with forward flexion. Left shoulder strength is 3/5. Patient has weakness in the scapular retractors. Patient needed ionto today due to being challenged. Patient is doing better without contracting his left upper trap with forward flexion. Patient will benefit from skilled therapy to improve muscle  education and reduce pain.    Personal Factors and Comorbidities  Comorbidity 2    Comorbidities  Diabetes; nasopharyngeal cancer with radiation and chemotherapy    Examination-Activity Limitations  Transfers;Bathing;Reach  Overhead;Carry;Lift;Hygiene/Grooming;Dressing    Examination-Participation Restrictions  Meal Prep;Cleaning;Driving;Laundry    Stability/Clinical Decision Making  Evolving/Moderate complexity    Rehab Potential  Excellent    PT Frequency  1x / week    PT Duration  8 weeks    PT Treatment/Interventions  Electrical Stimulation;Iontophoresis 4mg /ml Dexamethasone;Neuromuscular re-education;Therapeutic exercise;Therapeutic activities;Patient/family education;Manual techniques;Dry needling;Passive range of motion;Joint Manipulations;Taping    PT Next Visit Plan  DN left pectoralis and RTC; ionto if needed, continue with scapular strengthening and UE ranger to work on scaula motion    PT Home Exercise Plan  Access Code: [place] and Agree with Plan of Care  Patient       Patient will benefit from skilled therapeutic intervention in order to improve the following deficits and impairments:  Decreased range of motion, Increased fascial restricitons, Pain, Decreased activity tolerance, Decreased mobility, Decreased strength  Visit Diagnosis: Muscle weakness (generalized)  Chronic left shoulder pain  Stiffness of left shoulder, not elsewhere classified     Problem List Patient Active Problem List   Diagnosis Date Noted  . 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, [place], [place], [address] Phone: [phone]   Fax:  [phone]  Name: DONTAYE HRABIK MRN: [id number] Date of Birth: September 20, 1970

## 2019-12-25 NOTE — Patient Instructions (Signed)
Access Code: [email or web address] URL: [email or web address] Date: 12/25/2019 Prepared by: Earlie Counts  Exercises Flexion-Extension Shoulder Pendulum with Table Support - 1 x daily - 7 x weekly - 1 sets - 10 reps Seated Shoulder External Rotation - 1 x daily - 7 x weekly - 1 sets - 15 reps - 5 sec hold Shoulder Flexion Wall Slide with Towel - 3 x daily - 7 x weekly - 1 sets - 10 reps Prone Scapular Retraction - 1 x daily - 7 x weekly - 1 sets - 15 reps Prone Shoulder Horizontal Abduction - 1 x daily - 7 x weekly - 1 sets - 15 reps [facility] [address] [place], [place] [address] Phone # [phone] Fax [phone]

## 2020-01-02 ENCOUNTER — Other Ambulatory Visit: Payer: Self-pay

## 2020-01-02 ENCOUNTER — Ambulatory Visit: Payer: 59 | Attending: Internal Medicine | Admitting: Physical Therapy

## 2020-01-02 DIAGNOSIS — G8929 Other chronic pain: Secondary | ICD-10-CM | POA: Diagnosis present

## 2020-01-02 DIAGNOSIS — M6281 Muscle weakness (generalized): Secondary | ICD-10-CM | POA: Insufficient documentation

## 2020-01-02 DIAGNOSIS — M25612 Stiffness of left shoulder, not elsewhere classified: Secondary | ICD-10-CM | POA: Diagnosis present

## 2020-01-02 DIAGNOSIS — M25512 Pain in left shoulder: Secondary | ICD-10-CM | POA: Insufficient documentation

## 2020-01-02 NOTE — Patient Instructions (Signed)
Access Code: [email or web address] URL: [email or web address] Date: 01/02/2020 Prepared by: Ruben Im  Exercises Flexion-Extension Shoulder Pendulum with Table Support - 1 x daily - 7 x weekly - 1 sets - 10 reps Seated Shoulder External Rotation - 1 x daily - 7 x weekly - 1 sets - 15 reps - 5 sec hold Shoulder Flexion Wall Slide with Towel - 3 x daily - 7 x weekly - 1 sets - 10 reps Prone Scapular Retraction - 1 x daily - 7 x weekly - 1 sets - 15 reps Prone Shoulder Horizontal Abduction - 1 x daily - 7 x weekly - 1 sets - 15 reps Chest Stretch on Foam 1/2 Roll Arms Extended - 1 x daily - 7 x weekly - 1 sets - 60 hold Seated Chest Stretch with Hands Behind Head - 1 x daily - 7 x weekly - 1 sets - 3 reps - 20 hold Sidelying Open Book Thoracic Rotation with Knee on Foam Roll - 1 x daily - 7 x weekly - 1 sets - 5 reps

## 2020-01-02 NOTE — Therapy (Addendum)
[facility] [address] [place], [place], [address] Phone: [phone]   Fax:  [phone]  Physical Therapy Treatment/Discharge Summary   Patient Details  Name: Jesse Newton MRN: [id number] Date of Birth: [birth year] Referring Provider (PT): Dr. Jilda Panda   Encounter Date: 01/02/2020  PT End of Session - 01/02/20 1913    Visit Number  6    Date for PT Re-Evaluation  01/22/20    Authorization Type  UHC    PT Start Time  6010    PT Stop Time  1230    PT Time Calculation (min)  45 min    Activity Tolerance  Patient tolerated treatment well       Past Medical History:  Diagnosis Date  . HOH (hard of hearing)   . 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 Soft palate injury    from radiation-surgery  . Wears hearing aid    both    Past Surgical History:  Procedure Laterality Date  . GASTROSTOMY W/ FEEDING TUBE  2007  . INGUINAL HERNIA REPAIR Bilateral 10/23/2014   Procedure: LAPAROSCOPIC INGUINAL HERNIA;  Surgeon: Coralie Keens, MD;  Location: [place];  Service: General;  Laterality: Bilateral;  . INSERTION OF MESH Bilateral 10/23/2014   Procedure: INSERTION OF MESH;  Surgeon: Coralie Keens, MD;  Location: [place];  Service: General;  Laterality: Bilateral;  . LARYNGOSCOPY    . NASOPHARYNGEAL BIOPSY    . PORT-A-CATH REMOVAL  2007   insertion and    There were no vitals filed for this visit.  Subjective Assessment - 01/02/20 1147    Subjective  95% better.  The only thing I feel is tightness on the front of the shoulder and some pain posterior shoulder.  I think my home ex's are "spot on."  Discomfort is only with abduction/horizontal abduction.  I can take my shirt on/off much better.    Currently in Pain?  No/denies    Pain Score  0-No pain         [facility] PT Assessment - 01/02/20 0001      Observation/Other Assessments   Focus on Therapeutic Outcomes (FOTO)   22%  limitation       AROM   Left Shoulder Flexion  132 Degrees    Left Shoulder ABduction  153 Degrees    Cervical Extension  62    Cervical - Right Side Bend  35    Cervical - Left Side Bend  40    Cervical - Right Rotation  50      Strength   Left Shoulder Flexion  4/5    Left Shoulder ABduction  4/5                   [facility] Adult PT Treatment/Exercise - 01/02/20 0001      Shoulder Exercises: Stretch   Corner Stretch Limitations  seated elbows out with thoracic extension 10x     Other Shoulder Stretches  supine lying T stretch     Other Shoulder Stretches  sidelying open books 10x       Moist Heat Therapy   Number Minutes Moist Heat  5 Minutes    Moist Heat Location  Shoulder      Manual Therapy   Soft tissue mobilization  pectorals, lats, supraspinatus, upper trap        Trigger Point Dry Needling - 01/02/20 0001    Consent Given?  Yes    Muscles  Treated Upper Quadrant  Pectoralis major;Pectoralis minor;Latissimus dorsi    Other Dry Needling  left only     Upper Trapezius Response  Palpable increased muscle length    Pectoralis Major Response  Palpable increased muscle length    Pectoralis Minor Response  Palpable increased muscle length    Supraspinatus Response  Palpable increased muscle length    Latissimus dorsi Response  Palpable increased muscle length    Teres major Response  Palpable increased muscle length           PT Education - 01/02/20 1912    Education Details  pectoral stretch supine, open books, seated pectoral stretch with thoracic extension    Person(s) Educated  Patient    Methods  Explanation;Demonstration;Handout    Comprehension  Returned demonstration;Verbalized understanding       PT Short Term Goals - 01/02/20 1918      PT SHORT TERM GOAL #1   Title  independent with intial HEP    Status  Achieved      PT SHORT TERM GOAL #2   Title  left shoulder flexion and abduction A/ROM >/= 120 degrees    Status  Achieved      PT  SHORT TERM GOAL #3   Title  left shoulder pain decreased >/= 25% due to improve mobility    Status  Achieved      PT SHORT TERM GOAL #4   Title  able to assess left shoulder strength due to reduction of pain    Status  Achieved        PT Long Term Goals - 01/02/20 1919      PT LONG TERM GOAL #1   Title  independent with advanced HEP    Time  8    Period  Weeks    Status  Partially Met      PT LONG TERM GOAL #2   Title  left shoulder A/ROM >/= 140 degrees for flexion and abduction so he is able to reach in an overhead cabinet without difficulty    Time  8    Period  Weeks    Status  Partially Met      PT LONG TERM GOAL #3   Title  able to carry an item at chest height due to left shoulder strength >/= 4/5 and reduction in pain    Status  Achieved      PT LONG TERM GOAL #4   Title  able to dress himself without difficulty due to left shoulder pain decreased >/= 75%    Status  Achieved            Plan - 01/02/20 1213    Clinical Impression Statement  The patient rates his overall improvement at 95%.  He reports greater ease with taking his shirt on/off and elevation movements.  Significant improvements in shoulder ROM in all planes as well as strength improvements.  He has responded well to DN and manual therapy to address myofascial taut bands and tender points.  He has met partial goals and does not wish to schedule any appts today but would like to see how he does with his HEP over the next 3 weeks.  If no further contact is made, will discharge him from PT at that time.    Examination-Activity Limitations  Transfers;Bathing;Reach Overhead;Carry;Lift;Hygiene/Grooming;Dressing    Rehab Potential  Excellent    PT Frequency  1x / week    PT Duration  8 weeks    PT  Treatment/Interventions  Printmaker;Iontophoresis 72m/ml Dexamethasone;Neuromuscular re-education;Therapeutic exercise;Therapeutic activities;Patient/family education;Manual techniques;Dry  needling;Passive range of motion;Joint Manipulations;Taping    PT Next Visit Plan  DN left pectoralis and RTC; ionto if needed, continue with scapular strengthening and UE ranger to work on scaula motion    PT Home Exercise Plan  [facility]      Patient will benefit from skilled therapeutic intervention in order to improve the following deficits and impairments:  Decreased range of motion, Increased fascial restricitons, Pain, Decreased activity tolerance, Decreased mobility, Decreased strength  Visit Diagnosis: Muscle weakness (generalized)  Chronic left shoulder pain  Stiffness of left shoulder, not elsewhere classified    PHYSICAL THERAPY DISCHARGE SUMMARY  Visits from Start of Care: 6 Current functional level related to goals / functional outcomes: See clinical impressions above.  The patient progressed very well and did make contact for follow up within 3 weeks if needed.     Remaining deficits: As above  Education / Equipment: HEP  Plan: Patient agrees to discharge.  Patient goals were partially met. Patient is being discharged due to meeting the stated rehab goals.  ?????     Problem List Patient Active Problem List   Diagnosis Date Noted  . [place]oph[place] carcinoma [place] 12/02/2013  . Osteorad[place]rosis of temporal bone ([place] 12/02/2013   SRuben Im PT 01/02/20 7:21 PM Phone: [phone]: [phone] Singh5/01/2020, 7:20 PM  [facility] [address] [place] [address]Phone: [phone]  Fax:  [phone] Name: BMAXXIMUS GOTAYMRN: [id number] of Birth: [birth year]

## 2021-09-08 DIAGNOSIS — C119 Malignant neoplasm of nasopharynx, unspecified: Secondary | ICD-10-CM | POA: Diagnosis not present

## 2021-09-14 DIAGNOSIS — H90A31 Mixed conductive and sensorineural hearing loss, unilateral, right ear with restricted hearing on the contralateral side: Secondary | ICD-10-CM | POA: Diagnosis not present

## 2021-11-10 DIAGNOSIS — H903 Sensorineural hearing loss, bilateral: Secondary | ICD-10-CM | POA: Diagnosis not present

## 2022-03-09 DIAGNOSIS — H938X1 Other specified disorders of right ear: Secondary | ICD-10-CM | POA: Diagnosis not present

## 2022-03-09 DIAGNOSIS — Z8522 Personal history of malignant neoplasm of nasal cavities, middle ear, and accessory sinuses: Secondary | ICD-10-CM | POA: Diagnosis not present

## 2022-03-09 DIAGNOSIS — Z08 Encounter for follow-up examination after completed treatment for malignant neoplasm: Secondary | ICD-10-CM | POA: Diagnosis not present

## 2022-04-04 ENCOUNTER — Ambulatory Visit: Payer: BC Managed Care – PPO | Admitting: Family Medicine

## 2022-04-04 ENCOUNTER — Encounter: Payer: Self-pay | Admitting: Family Medicine

## 2022-04-04 VITALS — BP 118/80 | HR 63 | Temp 97.4°F | Ht 73.5 in | Wt 212.2 lb

## 2022-04-04 DIAGNOSIS — E119 Type 2 diabetes mellitus without complications: Secondary | ICD-10-CM | POA: Insufficient documentation

## 2022-04-04 DIAGNOSIS — C119 Malignant neoplasm of nasopharynx, unspecified: Secondary | ICD-10-CM | POA: Diagnosis not present

## 2022-04-04 DIAGNOSIS — Z1211 Encounter for screening for malignant neoplasm of colon: Secondary | ICD-10-CM

## 2022-04-04 LAB — POCT GLYCOSYLATED HEMOGLOBIN (HGB A1C): Hemoglobin A1C: 7 % — AB (ref 4.0–5.6)

## 2022-04-04 MED ORDER — METFORMIN HCL 500 MG PO TABS: 500.0000 mg | ORAL_TABLET | Freq: Two times a day (BID) | ORAL | 1 refills | Status: DC

## 2022-04-04 MED ORDER — ROSUVASTATIN CALCIUM 10 MG PO TABS: 10.0000 mg | ORAL_TABLET | Freq: Every day | ORAL | 3 refills | Status: DC

## 2022-04-04 NOTE — Patient Instructions (Signed)
Check with your insurance company to see what vaccines they cover. You may go to any pharmacy to get your vaccinations done.

## 2022-04-04 NOTE — Progress Notes (Unsigned)
New Patient Office Visit  Subjective    Patient ID: Jesse Newton, male    DOB: [birth year]  Age: 51 y.o. MRN: [id number]  CC:  Chief Complaint  Patient presents with   Establish Care    HPI Jesse Newton presents to establish care. Patient reports he has a history of diabetes, on metformin 500 BID, reports he is tolerating the medication well. Patient states he has a history of nasopharyngeal carcinoma. States that he often has trouble with his balance and he has chronic ringing in his ears, wear hearing aids but this does not help much. Has no new symptoms or issues to report today.  I reviewed his medications, PMH, social history, and past surgical history.  Current Outpatient Medications  Medication Instructions   metFORMIN (GLUCOPHAGE) 500 mg, Oral, 2 times daily with meals   pentoxifylline (TRENTAL) 400 mg, Oral, As needed   rosuvastatin (CRESTOR) 10 mg, Oral, Daily      Past Medical History:  Diagnosis Date   Diabetes mellitus without complication ([place])    HOH (hard of hearing)    Hyperlipidemia    Nasopharyngeal cancer ([place]) 08/29/2005   Soft palate injury    from radiation-surgery   Wears hearing aid    both    Past Surgical History:  Procedure Laterality Date   GASTROSTOMY W/ FEEDING TUBE  2007   INGUINAL HERNIA REPAIR Bilateral 10/23/2014   Procedure: LAPAROSCOPIC INGUINAL HERNIA;  Surgeon: Coralie Keens, MD;  Location: [place];  Service: General;  Laterality: Bilateral;   INSERTION OF MESH Bilateral 10/23/2014   Procedure: INSERTION OF MESH;  Surgeon: Coralie Keens, MD;  Location: [place];  Service: General;  Laterality: Bilateral;   [place] REMOVAL  2007   insertion and    Family History  Problem Relation Age of Onset   Heart attack Father    Heart disease Father    Hyperlipidemia Father    Diabetes Brother    Esophageal cancer Maternal Grandmother    Heart  attack Maternal Grandfather    CVA Paternal Grandmother    CVA Paternal Grandfather     Social History   Socioeconomic History   Marital status: Single    Spouse name: Not on file   Number of children: Not on file   Years of education: Not on file   Highest education level: Not on file  Occupational History   Not on file  Tobacco Use   Smoking status: Former    Types: Cigarettes    Quit date: 10/17/2005    Years since quitting: 16.4   Smokeless tobacco: Never  Vaping Use   Vaping Use: Former  Substance and Sexual Activity   Alcohol use: Yes    Comment: occ   Drug use: Not Currently   Sexual activity: Yes  Other Topics Concern   Not on file  Social History Narrative   Not on file   Social Determinants of Health   Financial Resource Strain: Not on file  Food Insecurity: Not on file  Transportation Needs: Not on file  Physical Activity: Not on file  Stress: Not on file  Social Connections: Not on file  Intimate Partner Violence: Not on file    Review of Systems  Constitutional:  Negative for malaise/fatigue and weight loss.  HENT:  Positive for hearing loss and tinnitus. Negative for sinus pain.   Eyes:  Negative for blurred vision.  Neurological:  Negative for tingling, focal weakness and headaches.  All other systems reviewed and are negative.       Objective    BP 118/80 (BP Location: Left Arm, Patient Position: Sitting, Cuff Size: Normal)   Pulse 63   Temp (!) 97.4 F (36.3 C) (Oral)   Ht 6' 1.5" (1.867 m)   Wt 212 lb 3.2 oz (96.3 kg)   SpO2 99%   BMI 27.62 kg/m   Physical Exam Vitals reviewed.  Constitutional:      Appearance: Normal appearance. He is well-groomed and normal weight.  HENT:     Head: Normocephalic and atraumatic.  Eyes:     Extraocular Movements: Extraocular movements intact.     Conjunctiva/sclera: Conjunctivae normal.     Pupils: Pupils are equal, round, and reactive to light.  Cardiovascular:     Rate and Rhythm: Normal  rate and regular rhythm.     Pulses: Normal pulses.     Heart sounds: S1 normal and S2 normal. No murmur heard. Pulmonary:     Effort: Pulmonary effort is normal.     Breath sounds: Normal breath sounds and air entry. No rales.  Abdominal:     General: Abdomen is flat. Bowel sounds are normal.     Palpations: Abdomen is soft.     Tenderness: There is no abdominal tenderness.  Musculoskeletal:     Right lower leg: No edema.     Left lower leg: No edema.  Neurological:     General: No focal deficit present.     Mental Status: He is alert and oriented to person, place, and time.     Gait: Gait is intact.  Psychiatric:        Mood and Affect: Mood and affect normal.      Lab Results  Component Value Date   HGBA1C 7.0 (A) 04/04/2022       Assessment & Plan:   Problem List Items Addressed This Visit       Respiratory   Nasopharyngeal carcinoma ([place])    Continues to follow regularly with his ENT specialist Dr. Owens Shark. It is likely that his tinnitus and his balance issues have more to do with his past surgeries and his history of nasopharyngeal CA, this is currently in remission at this time. We discussed vestibular rehab but pt reports that it doesn't bother him that much. Will hold off on referral for now.        Endocrine   Diabetes mellitus without complication ([place]) - Primary (Chronic)    A1C today is at goal at 7.0, pt reports he hasn't really been taking his metformin twice a day, I encouraged him to take it as prescribed twice a day. He has not had his annual bloodwork done in some time so I will order CMP, TSH, Lipid, urine albumin, etc for annual surveillance.       Relevant Medications   metFORMIN (GLUCOPHAGE) 500 MG tablet   rosuvastatin (CRESTOR) 10 MG tablet   Other Relevant Orders   POC HgB A1c (Completed)   CMP (Completed)   Lipid Panel (Completed)   TSH (Completed)   Microalbumin/Creatinine Ratio, Urine (Completed)   Other Visit Diagnoses     Colon  cancer screening       Relevant Orders   Ambulatory referral to Gastroenterology       Return in about 6 months (around 10/05/2022) for DM follow up.   Farrel Conners, MD

## 2022-04-05 LAB — COMPREHENSIVE METABOLIC PANEL
ALT: 64 IU/L — ABNORMAL HIGH (ref 0–44)
AST: 36 IU/L (ref 0–40)
Albumin/Globulin Ratio: 2.2 (ref 1.2–2.2)
Albumin: 4.7 g/dL (ref 3.8–4.9)
Alkaline Phosphatase: 60 IU/L (ref 44–121)
BUN/Creatinine Ratio: 13 (ref 9–20)
BUN: 12 mg/dL (ref 6–24)
Bilirubin Total: 0.6 mg/dL (ref 0.0–1.2)
CO2: 26 mmol/L (ref 20–29)
Calcium: 9.8 mg/dL (ref 8.7–10.2)
Chloride: 99 mmol/L (ref 96–106)
Creatinine, Ser: 0.89 mg/dL (ref 0.76–1.27)
Globulin, Total: 2.1 g/dL (ref 1.5–4.5)
Glucose: 127 mg/dL — ABNORMAL HIGH (ref 70–99)
Potassium: 4.5 mmol/L (ref 3.5–5.2)
Sodium: 139 mmol/L (ref 134–144)
Total Protein: 6.8 g/dL (ref 6.0–8.5)
eGFR: 103 mL/min/{1.73_m2} (ref >59)

## 2022-04-05 LAB — LIPID PANEL
Chol/HDL Ratio: 4.5 ratio (ref 0.0–5.0)
Cholesterol, Total: 186 mg/dL (ref 100–199)
HDL: 41 mg/dL (ref >39)
LDL Chol Calc (NIH): 120 mg/dL — ABNORMAL HIGH (ref 0–99)
Triglycerides: 138 mg/dL (ref 0–149)
VLDL Cholesterol Cal: 25 mg/dL (ref 5–40)

## 2022-04-05 LAB — TSH: TSH: 2.84 u[IU]/mL (ref 0.450–4.500)

## 2022-04-05 LAB — MICROALBUMIN / CREATININE URINE RATIO
Creatinine, Urine: 121.8 mg/dL
Microalb/Creat Ratio: 7 mg/g creat (ref 0–29)
Microalbumin, Urine: 8.4 ug/mL

## 2022-04-05 NOTE — Assessment & Plan Note (Signed)
Continues to follow regularly with his ENT specialist Dr. Owens Shark. It is likely that his tinnitus and his balance issues have more to do with his past surgeries and his history of nasopharyngeal CA, this is currently in remission at this time. We discussed vestibular rehab but pt reports that it doesn't bother him that much. Will hold off on referral for now.

## 2022-04-05 NOTE — Assessment & Plan Note (Signed)
A1C today is at goal at 7.0, pt reports he hasn't really been taking his metformin twice a day, I encouraged him to take it as prescribed twice a day. He has not had his annual bloodwork done in some time so I will order CMP, TSH, Lipid, urine albumin, etc for annual surveillance.

## 2022-04-08 ENCOUNTER — Encounter: Payer: Self-pay | Admitting: Gastroenterology

## 2022-04-12 ENCOUNTER — Encounter: Payer: Self-pay | Admitting: Family Medicine

## 2022-04-14 ENCOUNTER — Ambulatory Visit (AMBULATORY_SURGERY_CENTER): Payer: Self-pay | Admitting: *Deleted

## 2022-04-14 ENCOUNTER — Telehealth: Payer: Self-pay | Admitting: *Deleted

## 2022-04-14 VITALS — Ht 75.0 in | Wt 213.8 lb

## 2022-04-14 DIAGNOSIS — Z1211 Encounter for screening for malignant neoplasm of colon: Secondary | ICD-10-CM

## 2022-04-14 MED ORDER — NA SULFATE-K SULFATE-MG SULF 17.5-3.13-1.6 GM/177ML PO SOLN: 1.0000 | Freq: Once | ORAL | 0 refills | Status: AC

## 2022-04-14 NOTE — Progress Notes (Signed)
  No trouble with anesthesia, denies being told they were difficult to intubate, or hx/fam hx of malignant hyperthermia per pt   No egg or soy allergy  No home oxygen use   No medications for weight loss taken  emmi information given  Pt denies constipation issues  Pt informed that we do not do prior authorizations for prep

## 2022-04-14 NOTE — Telephone Encounter (Signed)
Jesse Newton,  I saw this pt at [facility] today; his colonoscopy is 05-05-22.  He has a history of nasopharyngeal carcinoma and soft palate injury in 2007.  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  He had an inguinal hernia repair in 2016 and stated he was told he was not difficulty to intubate.  Would you please review his record?  Thanks, [employer]

## 2022-04-15 NOTE — Telephone Encounter (Signed)
Kristen,  This pt is cleared for anesthetic care at [facility].  Thanks,  Kunta Hilleary 

## 2022-04-15 NOTE — Telephone Encounter (Signed)
noted 

## 2022-04-21 ENCOUNTER — Encounter: Payer: Self-pay | Admitting: Gastroenterology

## 2022-05-03 ENCOUNTER — Telehealth: Payer: Self-pay | Admitting: Gastroenterology

## 2022-05-03 NOTE — Telephone Encounter (Signed)
Inbound call from patient stating that yesterday he had 5 spoon fulls on baked beans and is scheduled to have a colonoscopy on 9/7. Patient is seeking advice on what he needs to do. Please advise.

## 2022-05-03 NOTE — Telephone Encounter (Signed)
Spoke with pt and encouraged him to push fluids.  Understanding voiced

## 2022-05-05 ENCOUNTER — Ambulatory Visit (AMBULATORY_SURGERY_CENTER): Payer: BC Managed Care – PPO | Admitting: Gastroenterology

## 2022-05-05 ENCOUNTER — Encounter: Payer: Self-pay | Admitting: Gastroenterology

## 2022-05-05 VITALS — BP 98/65 | HR 58 | Temp 97.1°F | Resp 16 | Ht 75.0 in | Wt 213.8 lb

## 2022-05-05 DIAGNOSIS — Z1211 Encounter for screening for malignant neoplasm of colon: Secondary | ICD-10-CM | POA: Diagnosis not present

## 2022-05-05 DIAGNOSIS — D125 Benign neoplasm of sigmoid colon: Secondary | ICD-10-CM | POA: Diagnosis not present

## 2022-05-05 DIAGNOSIS — D128 Benign neoplasm of rectum: Secondary | ICD-10-CM

## 2022-05-05 DIAGNOSIS — D124 Benign neoplasm of descending colon: Secondary | ICD-10-CM

## 2022-05-05 DIAGNOSIS — D122 Benign neoplasm of ascending colon: Secondary | ICD-10-CM

## 2022-05-05 MED ORDER — SODIUM CHLORIDE 0.9 % IV SOLN: 500.0000 mL | Freq: Once | INTRAVENOUS | Status: DC

## 2022-05-05 NOTE — Progress Notes (Signed)
VS completed by DT.  Pt's states no medical or surgical changes since previsit or office visit.  

## 2022-05-05 NOTE — Progress Notes (Signed)
[facility] History and Physical   Primary Care Physician:  Farrel Conners, MD   Reason for Procedure:   Colon cancer screening  Plan:    colonoscopy     HPI: Jesse Newton is a 51 y.o. male  here for colonoscopy screening 0- first exam. Patient denies any bowel symptoms at this time other than mild constipation. No family history of colon cancer known. Otherwise feels well without any cardiopulmonary symptoms.   I have discussed risks / benefits of anesthesia and endoscopic procedure with Vladimir Crofts Vanvranken and they wish to proceed with the exams as outlined today.    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   Soft palate injury    from radiation-surgery   Wears hearing aid    both    Past Surgical History:  Procedure Laterality Date   GASTROSTOMY W/ FEEDING TUBE  2007   INGUINAL HERNIA REPAIR Bilateral 10/23/2014   Procedure: LAPAROSCOPIC INGUINAL HERNIA;  Surgeon: Coralie Keens, MD;  Location: [facility];  Service: General;  Laterality: Bilateral;   INSERTION OF MESH Bilateral 10/23/2014   Procedure: INSERTION OF MESH;  Surgeon: Coralie Keens, MD;  Location: [facility];  Service: General;  Laterality: Bilateral;   [place] REMOVAL  2007   insertion and    Prior to Admission medications   Medication Sig Start Date End Date Taking? Authorizing Provider  metFORMIN (GLUCOPHAGE) 500 MG tablet Take 1 tablet (500 mg total) by mouth 2 (two) times daily with a meal. 04/04/22  Yes Farrel Conners, MD  Multiple Vitamin (MULTIVITAMIN ADULT PO) Take by mouth daily.   Yes [provider]  pentoxifylline (TRENTAL) 400 MG CR tablet Take 400 mg by  mouth daily. 03/09/22  Yes [provider]  rosuvastatin (CRESTOR) 10 MG tablet Take 1 tablet (10 mg total) by mouth daily. 04/04/22  Yes Farrel Conners, MD    Current Outpatient Medications  Medication Sig Dispense Refill   metFORMIN (GLUCOPHAGE) 500 MG tablet Take 1 tablet (500 mg total) by mouth 2 (two) times daily with a meal. 180 tablet 1   Multiple Vitamin (MULTIVITAMIN ADULT PO) Take by mouth daily.     pentoxifylline (TRENTAL) 400 MG CR tablet Take 400 mg by mouth daily.     rosuvastatin (CRESTOR) 10 MG tablet Take 1 tablet (10 mg total) by mouth daily. 90 tablet 3   Current Facility-Administered Medications  Medication Dose Route Frequency Provider Last Rate Last Admin   0.9 %  sodium chloride infusion  500 mL Intravenous Once Pola Furno, Carlota Raspberry, MD        Allergies as of 05/05/2022   (No Known Allergies)    Family History  Problem Relation Age of Onset   Colon polyps Father    Heart attack Father    Heart disease Father    Hyperlipidemia Father    Diabetes Brother    Esophageal cancer Maternal Grandmother    Heart attack Maternal Grandfather    CVA Paternal Grandmother    CVA Paternal Grandfather    Colon cancer Neg Hx    Rectal cancer Neg Hx    Stomach cancer Neg Hx  Social History   Socioeconomic History   Marital status: Single    Spouse name: Not on file   Number of children: Not on file   Years of education: Not on file   Highest education level: Not on file  Occupational History   Not on file  Tobacco Use   Smoking status: Former    Types: Cigarettes    Quit date: 10/17/2005    Years since quitting: 16.5   Smokeless tobacco: Never  Vaping Use   Vaping Use: Every day  Substance and Sexual Activity   Alcohol use: Yes    Comment: occ   Drug use: Not Currently   Sexual activity: Yes  Other Topics Concern   Not on file  Social History Narrative   Not on file   Social Determinants of Health   Financial Resource Strain: Not on  file  Food Insecurity: Not on file  Transportation Needs: Not on file  Physical Activity: Not on file  Stress: Not on file  Social Connections: Not on file  Intimate Partner Violence: Not on file    Review of Systems: All other review of systems negative except as mentioned in the HPI.  Physical Exam: Vital signs BP 114/75   Pulse 74   Temp (!) 97.1 F (36.2 C) (Temporal)   Ht '6\' 3"'$  (1.905 m)   Wt 213 lb 12.8 oz (97 kg)   SpO2 100%   BMI 26.72 kg/m   General:   Alert,  Well-developed, pleasant and cooperative in NAD Lungs:  Clear throughout to auscultation.   Heart:  Regular rate and rhythm Abdomen:  Soft, nontender and nondistended.   Neuro/Psych:  Alert and cooperative. Normal mood and affect. A and O x 3  Jolly Mango, MD [facility]

## 2022-05-05 NOTE — Op Note (Signed)
[place] Patient Name: Mekiah Cambridge Procedure Date: 05/05/2022 10:15 AM MRN: [id number] Endoscopist: Remo Lipps P. Havery Moros , MD Age: 52 Referring MD:  Date of Birth: 11/13/1970 Gender: Male Account #: [id number] Procedure:                Colonoscopy Indications:              Screening for colorectal malignant neoplasm, This                            is the patient's first colonoscopy Medicines:                Monitored Anesthesia Care Procedure:                Pre-Anesthesia Assessment:                           - Prior to the procedure, a History and Physical                            was performed, and patient medications and                            allergies were reviewed. The patient's tolerance of                            previous anesthesia was also reviewed. The risks                            and benefits of the procedure and the sedation                            options and risks were discussed with the patient.                            All questions were answered, and informed consent                            was obtained. Prior Anticoagulants: The patient has                            taken no previous anticoagulant or antiplatelet                            agents. ASA Grade Assessment: II - A patient with                            mild systemic disease. After reviewing the risks                            and benefits, the patient was deemed in                            satisfactory condition to undergo the procedure.  After obtaining informed consent, the colonoscope                            was passed under direct vision. Throughout the                            procedure, the patient's blood pressure, pulse, and                            oxygen saturations were monitored continuously. The                            Olympus CF-HQ190L (Serial# [id number]) Colonoscope was                            introduced through the  anus and advanced to the the                            cecum, identified by appendiceal orifice and                            ileocecal valve. The colonoscopy was performed                            without difficulty. The patient tolerated the                            procedure well. The quality of the bowel                            preparation was good. The ileocecal valve,                            appendiceal orifice, and rectum were photographed. Scope In: 10:28:35 AM Scope Out: 10:59:39 AM Scope Withdrawal Time: 0 hours 27 minutes 8 seconds  Total Procedure Duration: 0 hours 31 minutes 4 seconds  Findings:                 The perianal and digital rectal examinations were                            normal.                           Two sessile polyps were found in the ascending                            colon. The polyps were diminutive in size. These                            polyps were removed with a cold snare. Resection                            and retrieval were complete.  A roughly 74m to 40 mm polyp was found in the                            ascending colon (2 folds back on the same side as                            the IC valve). The polyp was sessile and asymmetric                            - one flatter area and one side that was more                            lobulated. Area was successfully injected with 10                            mL saline for a lift polypectomy and the entire                            lesion lifted well. The polyp was then removed with                            a piecemeal technique using a cold snare. Resection                            and retrieval were complete. It was observed for                            several minutes and no bleeding. Area distal and                            across the lumen from the lesion was tattooed with                            an injection of Spot (carbon black).                            A 5 mm polyp was found in the descending colon. The                            polyp was sessile. The polyp was removed with a                            cold snare. Resection and retrieval were complete.                           Two sessile polyps were found in the sigmoid colon.                            The polyps were 3 to 4 mm in size. These polyps  were removed with a cold snare. Resection and                            retrieval were complete.                           Two sessile polyps were found in the rectum. The                            polyps were 3 to 7 mm in size. These polyps were                            removed with a cold snare. Resection and retrieval                            were complete.                           Internal hemorrhoids were found during                            retroflexion. The hemorrhoids were small.                           The exam was otherwise without abnormality. Complications:            No immediate complications. Estimated blood loss:                            Minimal. Estimated Blood Loss:     Estimated blood loss was minimal. Impression:               - Two diminutive polyps in the ascending colon,                            removed with a cold snare. Resected and retrieved.                           - One 35-40 mm polyp in the ascending colon,                            removed piecemeal using a cold snare. Resected and                            retrieved. Injected. Tattooed.                           - One 5 mm polyp in the descending colon, removed                            with a cold snare. Resected and retrieved.                           - Two 3 to 4 mm polyps in the sigmoid colon,  removed with a cold snare. Resected and retrieved.                           - Two 3 to 7 mm polyps in the rectum, removed with                            a cold snare.  Resected and retrieved.                           - Internal hemorrhoids.                           - The examination was otherwise normal. Recommendation:           - Patient has a contact number available for                            emergencies. The signs and symptoms of potential                            delayed complications were discussed with the                            patient. Return to normal activities tomorrow.                            Written discharge instructions were provided to the                            patient.                           - Resume previous diet.                           - Continue present medications.                           - Await pathology results.                           - Anticipate repeat colonoscopy within 6 months or                            so for close surveillance Marla Pouliot P. Dallie Patton, MD 05/05/2022 11:08:11 AM This report has been signed electronically.

## 2022-05-05 NOTE — Progress Notes (Signed)
Called to room to assist during endoscopic procedure.  Patient ID and intended procedure confirmed with present staff. Received instructions for my participation in the procedure from the performing physician.  

## 2022-05-05 NOTE — Progress Notes (Signed)
To pacu, VSS. Report to [email or web address] 

## 2022-05-05 NOTE — Patient Instructions (Signed)
Handout on polyps given.  Anticipate repeat colon in 6 months, Dr. Doyne Keel office will notify you   YOU HAD AN ENDOSCOPIC PROCEDURE TODAY AT [facility]:   Refer to the procedure report that was given to you for any specific questions about what was found during the examination.  If the procedure report does not answer your questions, please call your gastroenterologist to clarify.  If you requested that your care partner not be given the details of your procedure findings, then the procedure report has been included in a sealed envelope for you to review at your convenience later.  YOU SHOULD EXPECT: Some feelings of bloating in the abdomen. Passage of more gas than usual.  Walking can help get rid of the air that was put into your GI tract during the procedure and reduce the bloating. If you had a lower endoscopy (such as a colonoscopy or flexible sigmoidoscopy) you may notice spotting of blood in your stool or on the toilet paper. If you underwent a bowel prep for your procedure, you may not have a normal bowel movement for a few days.  Please Note:  You might notice some irritation and congestion in your nose or some drainage.  This is from the oxygen used during your procedure.  There is no need for concern and it should clear up in a day or so.  SYMPTOMS TO REPORT IMMEDIATELY:  Following lower endoscopy (colonoscopy or flexible sigmoidoscopy):  Excessive amounts of blood in the stool  Significant tenderness or worsening of abdominal pains  Swelling of the abdomen that is new, acute  Fever of 100F or higher   For urgent or emergent issues, a gastroenterologist can be reached at any hour by calling [phone]. Do not use MyChart messaging for urgent concerns.    DIET:  We do recommend a small meal at first, but then you may proceed to your regular diet.  Drink plenty of fluids but you should avoid alcoholic beverages for 24 hours.  ACTIVITY:  You should plan to  take it easy for the rest of today and you should NOT DRIVE or use heavy machinery until tomorrow (because of the sedation medicines used during the test).    FOLLOW UP: Our staff will call the number listed on your records the next business day following your procedure.  We will call around 7:15- 8:00 am to check on you and address any questions or concerns that you may have regarding the information given to you following your procedure. If we do not reach you, we will leave a message.  If you develop any symptoms (ie: fever, flu-like symptoms, shortness of breath, cough etc.) before then, please call [phone].  If you test positive for Covid 19 in the 2 weeks post procedure, please call and report this information to [place].    If any biopsies were taken you will be contacted by phone or by letter within the next 1-3 weeks.  Please call us at [phone] if you have not heard about the biopsies in 3 weeks.    SIGNATURES/CONFIDENTIALITY: You and/or your care partner have signed paperwork which will be entered into your electronic medical record.  These signatures attest to the fact that that the information above on your After Visit Summary has been reviewed and is understood.  Full responsibility of the confidentiality of this discharge information lies with you and/or your care-partner.

## 2022-05-06 ENCOUNTER — Telehealth: Payer: Self-pay

## 2022-05-06 NOTE — Telephone Encounter (Signed)
  Follow up Call-     05/05/2022    9:40 AM  Call back number  Post procedure Call Back phone  # [phone]  Permission to leave phone message Yes     Patient questions:  Do you have a fever, pain , or abdominal swelling? No. Pain Score  0 *  Have you tolerated food without any problems? Yes.    Have you been able to return to your normal activities? Yes.    Do you have any questions about your discharge instructions: Diet   No. Medications  No. Follow up visit  No.  Do you have questions or concerns about your Care? No.  Actions: * If pain score is 4 or above: No action needed, pain <4.

## 2022-05-10 ENCOUNTER — Encounter: Payer: Self-pay | Admitting: Gastroenterology

## 2022-07-27 DIAGNOSIS — C119 Malignant neoplasm of nasopharynx, unspecified: Secondary | ICD-10-CM | POA: Diagnosis not present

## 2022-10-03 ENCOUNTER — Encounter: Payer: Self-pay | Admitting: Gastroenterology

## 2022-10-03 ENCOUNTER — Telehealth: Payer: Self-pay | Admitting: Gastroenterology

## 2022-10-03 NOTE — Telephone Encounter (Signed)
PT is calling to schedule colonoscopy. He just had one 05/05/2022 and he wanted to confirm if he is to have one every 6 month or yearly. Please advise.

## 2022-10-03 NOTE — Telephone Encounter (Signed)
Jesse Newton, patient is due 19-month from his last procedure. There is a recall in epic, he can be scheduled for a March appointment. Thanks

## 2022-10-03 NOTE — Telephone Encounter (Signed)
PT has been scheduled.

## 2022-10-06 ENCOUNTER — Encounter: Payer: Self-pay | Admitting: Family Medicine

## 2022-10-06 ENCOUNTER — Ambulatory Visit (INDEPENDENT_AMBULATORY_CARE_PROVIDER_SITE_OTHER): Payer: BC Managed Care – PPO | Admitting: Family Medicine

## 2022-10-06 VITALS — BP 100/60 | HR 75 | Temp 97.9°F | Ht 75.0 in | Wt 211.9 lb

## 2022-10-06 DIAGNOSIS — E119 Type 2 diabetes mellitus without complications: Secondary | ICD-10-CM

## 2022-10-06 LAB — POCT GLYCOSYLATED HEMOGLOBIN (HGB A1C): Hemoglobin A1C: 6.7 % — AB (ref 4.0–5.6)

## 2022-10-06 MED ORDER — METFORMIN HCL 500 MG PO TABS: 500.0000 mg | ORAL_TABLET | Freq: Two times a day (BID) | ORAL | 1 refills | Status: DC

## 2022-10-06 NOTE — Progress Notes (Signed)
Established Patient Office Visit  Subjective   Patient ID: Jesse Newton, male    DOB: July 29, 1971  Age: 52 y.o. MRN: [id number]  Chief Complaint  Patient presents with   Medical Management of Chronic Issues    Patient is here for follow up on DM. He reports he is taking his medication twice a day as prescribed,  no side effects to the medication, A1C performed in office today and is 6.7. patient was reminded to get his eye exam done this year, no new symptoms to report.    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     Objective:     BP 100/60 (BP Location: Left Arm, Patient Position: Sitting, Cuff Size: Normal)   Pulse 75   Temp 97.9 F (36.6 C) (Oral)   Ht '6\' 3"'$  (1.905 m)   Wt 211 lb 14.4 oz (96.1 kg)   SpO2 100%   BMI 26.49 kg/m    Physical Exam Vitals reviewed.  Constitutional:      Appearance: Normal appearance. He is well-groomed and normal weight.  Eyes:     Conjunctiva/sclera: Conjunctivae normal.  Cardiovascular:     Rate and Rhythm: Normal rate and regular rhythm.     Heart sounds: S1 normal and S2 normal. No murmur heard. Pulmonary:     Effort: Pulmonary effort is normal.     Breath sounds: Normal breath sounds and air entry.  Musculoskeletal:     Right lower leg: No edema.     Left lower leg: No edema.  Neurological:     General: No focal deficit present.     Mental Status: He is alert and oriented to person, place, and time.     Gait:  Gait is intact.  Psychiatric:        Mood and Affect: Mood and affect normal.    Diabetic Foot Exam - Simple   Simple Foot Form Diabetic Foot exam was performed with the following findings: Yes 10/06/2022  3:59 PM  Visual Inspection No deformities, no ulcerations, no other skin breakdown bilaterally: Yes Sensation Testing Intact to touch and monofilament testing bilaterally: Yes Pulse Check Posterior Tibialis and Dorsalis pulse intact bilaterally: Yes Comments       Results for orders placed or performed in visit on 10/06/22  POC HgB A1c  Result Value Ref Range   Hemoglobin A1C 6.7 (A) 4.0 - 5.6 %   HbA1c POC (<> result, manual entry)     HbA1c, POC (prediabetic range)     HbA1c, POC (controlled diabetic range)        The 10-year ASCVD risk score (Arnett DK, et al., 2019) is: 6.4%    Assessment & Plan:   Problem List Items Addressed This Visit       Unprioritized   Diabetes mellitus without complication ([place]) - Primary (Chronic)    A1C is 6.7, well controlled on metformin 500 mg BID, I have refilled this medication for him. Foot exam performed today, will see back  in 6 months for his annual physical.       Relevant Medications   metFORMIN (GLUCOPHAGE) 500 MG tablet   Other Relevant Orders   POC HgB A1c (Completed)    Return in about 6 months (around 04/06/2023) for annual physical exam.    Farrel Conners, MD

## 2022-10-06 NOTE — Assessment & Plan Note (Signed)
A1C is 6.7, well controlled on metformin 500 mg BID, I have refilled this medication for him. Foot exam performed today, will see back in 6 months for his annual physical.

## 2022-10-20 DIAGNOSIS — E119 Type 2 diabetes mellitus without complications: Secondary | ICD-10-CM | POA: Diagnosis not present

## 2022-10-28 ENCOUNTER — Ambulatory Visit (AMBULATORY_SURGERY_CENTER): Payer: BC Managed Care – PPO

## 2022-10-28 ENCOUNTER — Encounter: Payer: Self-pay | Admitting: Gastroenterology

## 2022-10-28 VITALS — Ht 75.0 in | Wt 215.0 lb

## 2022-10-28 DIAGNOSIS — Z86010 Personal history of colonic polyps: Secondary | ICD-10-CM

## 2022-10-28 MED ORDER — NA SULFATE-K SULFATE-MG SULF 17.5-3.13-1.6 GM/177ML PO SOLN: 1.0000 | Freq: Once | ORAL | 0 refills | Status: AC

## 2022-10-28 NOTE — Progress Notes (Signed)
No egg or soy allergy known to patient   No issues known to pt with past sedation with any surgeries or procedures  Patient denies ever being told they had issues or difficulty with intubation   No FH of Malignant Hyperthermia  Pt is not on diet pills  Pt is not on  home 02   Pt is not on blood thinners   Pt denies issues with constipation   No A fib or A flutter  Have any cardiac testing pending--no Pt instructed to use [email or web address] or GoodRx for a price reduction on prep   

## 2022-11-16 ENCOUNTER — Encounter: Payer: Self-pay | Admitting: Gastroenterology

## 2022-11-16 ENCOUNTER — Ambulatory Visit (AMBULATORY_SURGERY_CENTER): Payer: BC Managed Care – PPO | Admitting: Gastroenterology

## 2022-11-16 VITALS — BP 105/64 | HR 68 | Temp 97.3°F | Resp 18 | Ht 75.0 in | Wt 215.0 lb

## 2022-11-16 DIAGNOSIS — Z86010 Personal history of colonic polyps: Secondary | ICD-10-CM

## 2022-11-16 DIAGNOSIS — D122 Benign neoplasm of ascending colon: Secondary | ICD-10-CM

## 2022-11-16 DIAGNOSIS — Z1211 Encounter for screening for malignant neoplasm of colon: Secondary | ICD-10-CM | POA: Diagnosis not present

## 2022-11-16 DIAGNOSIS — K635 Polyp of colon: Secondary | ICD-10-CM | POA: Diagnosis not present

## 2022-11-16 DIAGNOSIS — Z09 Encounter for follow-up examination after completed treatment for conditions other than malignant neoplasm: Secondary | ICD-10-CM | POA: Diagnosis not present

## 2022-11-16 MED ORDER — SODIUM CHLORIDE 0.9 % IV SOLN: 500.0000 mL | Freq: Once | INTRAVENOUS | Status: DC

## 2022-11-16 NOTE — Progress Notes (Signed)
[facility] History and Physical   Primary Care Physician:  Farrel Conners, MD   Reason for Procedure:   History of colon polyps  Plan:    colonoscopy     HPI: Jesse Newton is a 52 y.o. male  here for colonoscopy surveillance - history of colonscopy in Sept 2023 with large polyp removed - adenoma with HGD amongst other polyps. Here for close surveillance..   Patient denies any bowel symptoms at this time. No family history of colon cancer known. Otherwise feels well without any cardiopulmonary symptoms.   I have discussed risks / benefits of anesthesia and endoscopic procedure with Jesse Newton and they wish to proceed with the exams as outlined today.    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   Wears hearing aid    both    Past Surgical History:  Procedure Laterality Date   COLONOSCOPY     GASTROSTOMY W/ FEEDING TUBE  08/29/2005   INGUINAL HERNIA REPAIR Bilateral 10/23/2014   Procedure: LAPAROSCOPIC INGUINAL HERNIA;  Surgeon: Coralie Keens, MD;  Location: [facility];  Service: General;  Laterality: Bilateral;   INSERTION OF MESH Bilateral 10/23/2014   Procedure: INSERTION OF MESH;  Surgeon: Coralie Keens, MD;  Location: [facility];  Service: General;  Laterality: Bilateral;   LARYNGOSCOPY     NASOPHARYNGEAL BIOPSY     PORT-A-CATH REMOVAL  08/29/2005   insertion and    Prior to Admission medications   Medication Sig Start Date End Date Taking? Authorizing Provider  metFORMIN (GLUCOPHAGE) 500 MG tablet Take 1 tablet (500 mg total) by mouth 2 (two) times daily with a meal. 10/06/22  Yes Farrel Conners, MD  Multiple Vitamin  (MULTIVITAMIN ADULT PO) Take by mouth daily.    [provider]  pentoxifylline (TRENTAL) 400 MG CR tablet Take 400 mg by mouth daily. 03/09/22   [provider]  rosuvastatin (CRESTOR) 10 MG tablet Take 1 tablet (10 mg total) by mouth daily. 04/04/22   Farrel Conners, MD    Current Outpatient Medications  Medication Sig Dispense Refill   metFORMIN (GLUCOPHAGE) 500 MG tablet Take 1 tablet (500 mg total) by mouth 2 (two) times daily with a meal. 180 tablet 1   Multiple Vitamin (MULTIVITAMIN ADULT PO) Take by mouth daily.     pentoxifylline (TRENTAL) 400 MG CR tablet Take 400 mg by mouth daily.     rosuvastatin (CRESTOR) 10 MG tablet Take 1 tablet (10 mg total) by mouth daily. 90 tablet 3   Current Facility-Administered Medications  Medication Dose Route Frequency Provider Last Rate Last Admin   0.9 %  sodium chloride infusion  500 mL Intravenous Once Zuri Bradway, Carlota Raspberry, MD        Allergies as of 11/16/2022 - Review Complete 11/16/2022  Allergen Reaction Noted   Tetrahydrocannabinol (thc non-synthetic) [tetrahydrocannabinol]  10/06/2022    Family History  Problem Relation Age of Onset   Colon polyps Father    Heart attack Father    Heart disease Father    Hyperlipidemia Father    Diabetes Brother    Esophageal cancer Maternal Grandmother    Heart attack Maternal  Grandfather    CVA Paternal Grandmother    CVA Paternal Grandfather    Colon cancer Neg Hx    Rectal cancer Neg Hx    Stomach cancer Neg Hx     Social History   Socioeconomic History   Marital status: Single    Spouse name: Not on file   Number of children: Not on file   Years of education: Not on file   Highest education level: Bachelor's degree (e.g., BA, AB, BS)  Occupational History   Not on file  Tobacco Use   Smoking status: Former    Types: Cigarettes    Quit date: 10/17/2005    Years since quitting: 17.0   Smokeless tobacco: Never  Vaping Use   Vaping Use: Every day  Substance  and Sexual Activity   Alcohol use: Yes    Comment: occ   Drug use: Not Currently   Sexual activity: Yes  Other Topics Concern   Not on file  Social History Narrative   Not on file   Social Determinants of Health   Financial Resource Strain: Low Risk  (10/03/2022)   Overall Financial Resource Strain (CARDIA)    Difficulty of Paying Living Expenses: Not hard at all  Food Insecurity: No Food Insecurity (10/03/2022)   Hunger Vital Sign    Worried About Running Out of Food in the Last Year: Never true    Ran Out of Food in the Last Year: Never true  Transportation Needs: No Transportation Needs (10/03/2022)   PRAPARE - Hydrologist (Medical): No    Lack of Transportation (Non-Medical): No  Physical Activity: Insufficiently Active (10/03/2022)   Exercise Vital Sign    Days of Exercise per Week: 2 days    Minutes of Exercise per Session: 30 min  Stress: No Stress Concern Present (10/03/2022)   [place]    Feeling of Stress : Not at all  Social Connections: Moderately Isolated (10/03/2022)   Social Connection and Isolation Panel [NHANES]    Frequency of Communication with Friends and Family: More than three times a week    Frequency of Social Gatherings with Friends and Family: Twice a week    Attends Religious Services: Never    Marine scientist or Organizations: No    Attends Music therapist: Not on file    Marital Status: Living with partner  Intimate Partner Violence: Not on file    Review of Systems: All other review of systems negative except as mentioned in the HPI.  Physical Exam: Vital signs BP 109/69   Pulse 62   Temp (!) 97.3 F (36.3 C) (Skin)   Ht 6\' 3"  (1.905 m)   Wt 215 lb (97.5 kg)   SpO2 98%   BMI 26.87 kg/m   General:   Alert,  Well-developed, pleasant and cooperative in NAD Lungs:  Clear throughout to auscultation.   Heart:  Regular rate and  rhythm Abdomen:  Soft, nontender and nondistended.   Neuro/Psych:  Alert and cooperative. Normal mood and affect. A and O x 3  Jolly Mango, MD [facility]

## 2022-11-16 NOTE — Progress Notes (Signed)
VS by CW  Pt's states no medical or surgical changes since previsit or office visit.  

## 2022-11-16 NOTE — Patient Instructions (Signed)
   Handouts on polyps & hemorrhoids given to you today  Await pathology results on polyps removed    YOU HAD AN ENDOSCOPIC PROCEDURE TODAY AT THE [facility]:   Refer to the procedure report that was given to you for any specific questions about what was found during the examination.  If the procedure report does not answer your questions, please call your gastroenterologist to clarify.  If you requested that your care partner not be given the details of your procedure findings, then the procedure report has been included in a sealed envelope for you to review at your convenience later.  YOU SHOULD EXPECT: Some feelings of bloating in the abdomen. Passage of more gas than usual.  Walking can help get rid of the air that was put into your GI tract during the procedure and reduce the bloating. If you had a lower endoscopy (such as a colonoscopy or flexible sigmoidoscopy) you may notice spotting of blood in your stool or on the toilet paper. If you underwent a bowel prep for your procedure, you may not have a normal bowel movement for a few days.  Please Note:  You might notice some irritation and congestion in your nose or some drainage.  This is from the oxygen used during your procedure.  There is no need for concern and it should clear up in a day or so.  SYMPTOMS TO REPORT IMMEDIATELY:  Following lower endoscopy (colonoscopy or flexible sigmoidoscopy):  Excessive amounts of blood in the stool  Significant tenderness or worsening of abdominal pains  Swelling of the abdomen that is new, acute  Fever of 100F or higher   For urgent or emergent issues, a gastroenterologist can be reached at any hour by calling [phone]. Do not use MyChart messaging for urgent concerns.    DIET:  We do recommend a small meal at first, but then you may proceed to your regular diet.  Drink plenty of fluids but you should avoid alcoholic beverages for 24 hours.  ACTIVITY:  You should plan to  take it easy for the rest of today and you should NOT DRIVE or use heavy machinery until tomorrow (because of the sedation medicines used during the test).    FOLLOW UP: Our staff will call the number listed on your records the next business day following your procedure.  We will call around 7:15- 8:00 am to check on you and address any questions or concerns that you may have regarding the information given to you following your procedure. If we do not reach you, we will leave a message.     If any biopsies were taken you will be contacted by phone or by letter within the next 1-3 weeks.  Please call us at [phone] if you have not heard about the biopsies in 3 weeks.    SIGNATURES/CONFIDENTIALITY: You and/or your care partner have signed paperwork which will be entered into your electronic medical record.  These signatures attest to the fact that that the information above on your After Visit Summary has been reviewed and is understood.  Full responsibility of the confidentiality of this discharge information lies with you and/or your care-partner. 

## 2022-11-16 NOTE — Progress Notes (Signed)
Vss nad trans to pacu 

## 2022-11-16 NOTE — Op Note (Signed)
[facility] Patient Name: Jesse Newton Procedure Date: 11/16/2022 9:27 AM MRN: [id number] Endoscopist: Remo Lipps P. Havery Moros , MD, [id number] Age: 52 Referring MD:  Date of Birth: May 03, 1971 Gender: Male Account #: [id number] Procedure:                Colonoscopy Indications:              High risk colon cancer surveillance: Personal                            history of colonic polyps - large advanced adenoma                            with HGD removed in piecemeal Sept 2023, amongst                            other polyps - here for close follow up                            surveillance. Medicines:                Monitored Anesthesia Care Procedure:                Pre-Anesthesia Assessment:                           - Prior to the procedure, a History and Physical                            was performed, and patient medications and                            allergies were reviewed. The patient's tolerance of                            previous anesthesia was also reviewed. The risks                            and benefits of the procedure and the sedation                            options and risks were discussed with the patient.                            All questions were answered, and informed consent                            was obtained. Prior Anticoagulants: The patient has                            taken no anticoagulant or antiplatelet agents. ASA                            Grade Assessment: III - A patient with severe  systemic disease. After reviewing the risks and                            benefits, the patient was deemed in satisfactory                            condition to undergo the procedure.                           After obtaining informed consent, the colonoscope                            was passed under direct vision. Throughout the                            procedure, the patient's blood pressure, pulse, and                             oxygen saturations were monitored continuously. The                            CF [id number] was introduced through the anus                            and advanced to the the cecum, identified by                            appendiceal orifice and ileocecal valve. The                            colonoscopy was performed without difficulty. The                            patient tolerated the procedure well. The quality                            of the bowel preparation was good. The ileocecal                            valve, appendiceal orifice, and rectum were                            photographed. Scope In: 9:28:56 AM Scope Out: 9:55:08 AM Scope Withdrawal Time: 0 hours 23 minutes 29 seconds  Total Procedure Duration: 0 hours 26 minutes 12 seconds  Findings:                 The perianal and digital rectal examinations were                            normal.                           A large post polypectomy scar was found in the  ascending colon. There was residual polyp tissue,                            roughly 48mm or so. This was mostly removed with                            cold snare en bloc, small peripheral area was                            scarred down, 38mm or so, removed with cold forceps                            avulsion. Periphery of the area treated with snare                            tip coagulation.                           A 3 mm polyp was found in the ascending colon. The                            polyp was flat along backside of fold near the                            polypectomy area. The polyp was removed with a cold                            snare. Resection and retrieval were complete.                           Internal hemorrhoids were found during retroflexion.                           The exam was otherwise without abnormality. Complications:            No immediate complications. Estimated  blood loss:                            Minimal. Estimated Blood Loss:     Estimated blood loss was minimal. Impression:               - Post-polypectomy scar in the ascending colon with                            small area of residual polypoid tissue treated as                            outlined.                           - One 3 mm polyp in the ascending colon, removed                            with a cold snare. Resected and retrieved.                           -  Internal hemorrhoids.                           - The examination was otherwise normal.                           - The GI Genius (intelligent endoscopy module),                            computer-aided polyp detection system powered by AI                            was utilized to detect colorectal polyps through                            enhanced visualization during colonoscopy. Recommendation:           - Patient has a contact number available for                            emergencies. The signs and symptoms of potential                            delayed complications were discussed with the                            patient. Return to normal activities tomorrow.                            Written discharge instructions were provided to the                            patient.                           - Resume previous diet.                           - Continue present medications.                           - Await pathology results. Remo Lipps P. Clifford Benninger, MD 11/16/2022 10:02:16 AM This report has been signed electronically.

## 2022-11-16 NOTE — Progress Notes (Signed)
Called to room to assist during endoscopic procedure.  Patient ID and intended procedure confirmed with present staff. Received instructions for my participation in the procedure from the performing physician.  

## 2022-11-17 ENCOUNTER — Telehealth: Payer: Self-pay

## 2022-11-17 NOTE — Telephone Encounter (Signed)
  Follow up Call-     11/16/2022    9:04 AM 05/05/2022    9:40 AM  Call back number  Post procedure Call Back phone  # [phone]  Permission to leave phone message Yes Yes     Patient questions:  Do you have a fever, pain , or abdominal swelling? No. Pain Score  0 *  Have you tolerated food without any problems? Yes.    Have you been able to return to your normal activities? Yes.    Do you have any questions about your discharge instructions: Diet   No. Medications  No. Follow up visit  No.  Do you have questions or concerns about your Care? No.  Actions: * If pain score is 4 or above: No action needed, pain <4.

## 2023-01-03 ENCOUNTER — Telehealth: Payer: Self-pay | Admitting: Family Medicine

## 2023-01-03 MED ORDER — FREESTYLE LIBRE 3 SENSOR MISC: 3 refills | Status: DC

## 2023-01-03 MED ORDER — FREESTYLE LIBRE 3 READER DEVI: 1.0000 | 1 refills | Status: DC

## 2023-01-03 NOTE — Telephone Encounter (Signed)
Ok to send order. 

## 2023-01-03 NOTE — Telephone Encounter (Signed)
Rx done. 

## 2023-01-03 NOTE — Telephone Encounter (Signed)
Pt is calling and would like new rx for freestyle libra 3 with readers , sensor please send to  [id number] - [place], [place] - [address] AT [facility] OF [facility] [address] Phone: [phone]  Fax: [phone]

## 2023-01-30 ENCOUNTER — Encounter: Payer: Self-pay | Admitting: Family Medicine

## 2023-01-30 DIAGNOSIS — E119 Type 2 diabetes mellitus without complications: Secondary | ICD-10-CM

## 2023-01-30 MED ORDER — FREESTYLE LIBRE 3 SENSOR MISC: 11 refills | Status: DC

## 2023-02-01 DIAGNOSIS — C119 Malignant neoplasm of nasopharynx, unspecified: Secondary | ICD-10-CM | POA: Diagnosis not present

## 2023-02-01 DIAGNOSIS — Y842 Radiological procedure and radiotherapy as the cause of abnormal reaction of the patient, or of later complication, without mention of misadventure at the time of the procedure: Secondary | ICD-10-CM | POA: Diagnosis not present

## 2023-02-01 DIAGNOSIS — M8738 Other secondary osteonecrosis, other site: Secondary | ICD-10-CM | POA: Diagnosis not present

## 2023-04-07 ENCOUNTER — Encounter: Payer: Self-pay | Admitting: Family Medicine

## 2023-04-07 ENCOUNTER — Telehealth: Payer: Self-pay | Admitting: Family Medicine

## 2023-04-07 ENCOUNTER — Ambulatory Visit (INDEPENDENT_AMBULATORY_CARE_PROVIDER_SITE_OTHER): Payer: BC Managed Care – PPO | Admitting: Family Medicine

## 2023-04-07 VITALS — BP 90/60 | HR 66 | Temp 97.9°F | Ht 73.75 in | Wt 212.1 lb

## 2023-04-07 DIAGNOSIS — Z0000 Encounter for general adult medical examination without abnormal findings: Secondary | ICD-10-CM | POA: Diagnosis not present

## 2023-04-07 DIAGNOSIS — E119 Type 2 diabetes mellitus without complications: Secondary | ICD-10-CM

## 2023-04-07 DIAGNOSIS — R21 Rash and other nonspecific skin eruption: Secondary | ICD-10-CM

## 2023-04-07 DIAGNOSIS — Z114 Encounter for screening for human immunodeficiency virus [HIV]: Secondary | ICD-10-CM

## 2023-04-07 DIAGNOSIS — Z1159 Encounter for screening for other viral diseases: Secondary | ICD-10-CM | POA: Diagnosis not present

## 2023-04-07 DIAGNOSIS — Z7984 Long term (current) use of oral hypoglycemic drugs: Secondary | ICD-10-CM | POA: Diagnosis not present

## 2023-04-07 LAB — MICROALBUMIN / CREATININE URINE RATIO
Creatinine,U: 62.2 mg/dL
Microalb Creat Ratio: 1.1 mg/g (ref 0.0–30.0)
Microalb, Ur: <0.7 mg/dL (ref 0.0–1.9)

## 2023-04-07 LAB — COMPREHENSIVE METABOLIC PANEL
ALT: 43 U/L (ref 0–53)
AST: 27 U/L (ref 0–37)
Albumin: 4.6 g/dL (ref 3.5–5.2)
Alkaline Phosphatase: 50 U/L (ref 39–117)
BUN: 12 mg/dL (ref 6–23)
CO2: 31 mEq/L (ref 19–32)
Calcium: 10 mg/dL (ref 8.4–10.5)
Chloride: 99 mEq/L (ref 96–112)
Creatinine, Ser: 1.02 mg/dL (ref 0.40–1.50)
GFR: 83.69 mL/min (ref >60.00)
Glucose, Bld: 144 mg/dL — ABNORMAL HIGH (ref 70–99)
Potassium: 4.6 mEq/L (ref 3.5–5.1)
Sodium: 136 mEq/L (ref 135–145)
Total Bilirubin: 0.5 mg/dL (ref 0.2–1.2)
Total Protein: 6.9 g/dL (ref 6.0–8.3)

## 2023-04-07 LAB — LIPID PANEL
Cholesterol: 186 mg/dL (ref 0–200)
HDL: 37.8 mg/dL — ABNORMAL LOW (ref >39.00)
NonHDL: 147.9
Total CHOL/HDL Ratio: 5
Triglycerides: 204 mg/dL — ABNORMAL HIGH (ref 0.0–149.0)
VLDL: 40.8 mg/dL — ABNORMAL HIGH (ref 0.0–40.0)

## 2023-04-07 LAB — HEMOGLOBIN A1C: Hgb A1c MFr Bld: 7 % — ABNORMAL HIGH (ref 4.6–6.5)

## 2023-04-07 LAB — LDL CHOLESTEROL, DIRECT: Direct LDL: 131 mg/dL

## 2023-04-07 MED ORDER — ROSUVASTATIN CALCIUM 10 MG PO TABS: 10.0000 mg | ORAL_TABLET | Freq: Every day | ORAL | 3 refills | Status: DC

## 2023-04-07 MED ORDER — METFORMIN HCL 500 MG PO TABS
500.0000 mg | ORAL_TABLET | Freq: Two times a day (BID) | ORAL | 5 refills | Status: DC
Start: 2023-04-07 — End: 2023-07-03

## 2023-04-07 MED ORDER — METFORMIN HCL 500 MG PO TABS
500.0000 mg | ORAL_TABLET | Freq: Two times a day (BID) | ORAL | 1 refills | Status: DC
Start: 2023-04-07 — End: 2023-04-07

## 2023-04-07 NOTE — Patient Instructions (Addendum)
Try 2 tablets twice a day of the metformin to see how you do with it.   Health Maintenance, Male Adopting a healthy lifestyle and getting preventive care are important in promoting health and wellness. Ask your health care provider about: The right schedule for you to have regular tests and exams. Things you can do on your own to prevent diseases and keep yourself healthy. What should I know about diet, weight, and exercise? Eat a healthy diet  Eat a diet that includes plenty of vegetables, fruits, low-fat dairy products, and lean protein. Do not eat a lot of foods that are high in solid fats, added sugars, or sodium. Maintain a healthy weight Body mass index (BMI) is a measurement that can be used to identify possible weight problems. It estimates body fat based on height and weight. Your health care provider can help determine your BMI and help you achieve or maintain a healthy weight. Get regular exercise Get regular exercise. This is one of the most important things you can do for your health. Most adults should: Exercise for at least 150 minutes each week. The exercise should increase your heart rate and make you sweat (moderate-intensity exercise). Do strengthening exercises at least twice a week. This is in addition to the moderate-intensity exercise. Spend less time sitting. Even light physical activity can be beneficial. Watch cholesterol and blood lipids Have your blood tested for lipids and cholesterol at 54 years of age, then have this test every 5 years. You may need to have your cholesterol levels checked more often if: Your lipid or cholesterol levels are high. You are older than 54 years of age. You are at high risk for heart disease. What should I know about cancer screening? Many types of cancers can be detected early and may often be prevented. Depending on your health history and family history, you may need to have cancer screening at various ages. This may include  screening for: Colorectal cancer. Prostate cancer. Skin cancer. Lung cancer. What should I know about heart disease, diabetes, and high blood pressure? Blood pressure and heart disease High blood pressure causes heart disease and increases the risk of stroke. This is more likely to develop in people who have high blood pressure readings or are overweight. Talk with your health care provider about your target blood pressure readings. Have your blood pressure checked: Every 3-5 years if you are 20-43 years of age. Every year if you are 62 years old or older. If you are between the ages of 47 and 34 and are a current or former smoker, ask your health care provider if you should have a one-time screening for abdominal aortic aneurysm (AAA). Diabetes Have regular diabetes screenings. This checks your fasting blood sugar level. Have the screening done: Once every three years after age 55 if you are at a normal weight and have a low risk for diabetes. More often and at a younger age if you are overweight or have a high risk for diabetes. What should I know about preventing infection? Hepatitis B If you have a higher risk for hepatitis B, you should be screened for this virus. Talk with your health care provider to find out if you are at risk for hepatitis B infection. Hepatitis C Blood testing is recommended for: Everyone born from [birth year] through 1967. Anyone with known risk factors for hepatitis C. Sexually transmitted infections (STIs) You should be screened each year for STIs, including gonorrhea and chlamydia, if: You are sexually active  and are younger than 54 years of age. You are older than 54 years of age and your health care provider tells you that you are at risk for this type of infection. Your sexual activity has changed since you were last screened, and you are at increased risk for chlamydia or gonorrhea. Ask your health care provider if you are at risk. Ask your health care  provider about whether you are at high risk for HIV. Your health care provider may recommend a prescription medicine to help prevent HIV infection. If you choose to take medicine to prevent HIV, you should first get tested for HIV. You should then be tested every 3 months for as long as you are taking the medicine. Follow these instructions at home: Alcohol use Do not drink alcohol if your health care provider tells you not to drink. If you drink alcohol: Limit how much you have to 0-2 drinks a day. Know how much alcohol is in your drink. In the [place] one drink equals one 12 oz bottle of beer (355 mL), one 5 oz glass of wine (148 mL), or one 1 oz glass of hard liquor (44 mL). Lifestyle Do not use any products that contain nicotine or tobacco. These products include cigarettes, chewing tobacco, and vaping devices, such as e-cigarettes. If you need help quitting, ask your health care provider. Do not use street drugs. Do not share needles. Ask your health care provider for help if you need support or information about quitting drugs. General instructions Schedule regular health, dental, and eye exams. Stay current with your vaccines. Tell your health care provider if: You often feel depressed. You have ever been abused or do not feel safe at home. Summary Adopting a healthy lifestyle and getting preventive care are important in promoting health and wellness. Follow your health care provider's instructions about healthy diet, exercising, and getting tested or screened for diseases. Follow your health care provider's instructions on monitoring your cholesterol and blood pressure. This information is not intended to replace advice given to you by your health care provider. Make sure you discuss any questions you have with your health care provider. Document Revised: 01/04/2021 Document Reviewed: 01/04/2021 Elsevier Patient Education  2024 [employer].

## 2023-04-07 NOTE — Telephone Encounter (Signed)
Pt states Dr. Casimiro Needle sent in a 90 supply for his metFormin 500 mg. However, his insurance will not cover a 90 supply without sending it as mail order. He does not want to do mail order. He wants to know if either a 30 day or 60 day supple could be called in instead so his insurance will cover it.

## 2023-04-07 NOTE — Telephone Encounter (Signed)
Rx done. 

## 2023-04-07 NOTE — Addendum Note (Signed)
Addended by: Donald Pore A on: 04/07/2023 03:03 PM   Modules accepted: Orders

## 2023-04-07 NOTE — Progress Notes (Signed)
Complete physical exam  Patient: Jesse Newton   DOB: Jan 08, 1971   52 y.o. Male  MRN: [id number]  Subjective:    Chief Complaint  Patient presents with   Annual Exam    Jesse Newton is a 51 y.o. male who presents today for a complete physical exam. He reports consuming a general diet. Home exercise routine includes tries to stay physical active. He generally feels well. He reports sleeping well. He does not have additional problems to discuss today.    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 Use to check glucose continuously   Multiple Vitamin (MULTIVITAMIN ADULT PO) Take by mouth daily.   pentoxifylline (TRENTAL) 400 MG CR tablet Take 400 mg by mouth daily.   [DISCONTINUED] metFORMIN (GLUCOPHAGE) 500 MG tablet Take 1 tablet (500 mg total) by mouth 2 (two) times daily with a meal.   [DISCONTINUED] rosuvastatin (CRESTOR) 10 MG tablet Take 1 tablet (10 mg total) by mouth daily.   [DISCONTINUED] Continuous Glucose Receiver (FREESTYLE LIBRE 3 READER) DEVI 1 each by Does not apply route as directed.   No facility-administered medications prior to visit.    Review of Systems  HENT:   Negative for hearing loss.   Eyes:  Negative for blurred vision.  Respiratory:  Negative for shortness of breath.   Cardiovascular:  Negative for chest pain.  Gastrointestinal: Negative.   Genitourinary: Negative.   Musculoskeletal:  Negative for back pain.  Neurological:  Negative for headaches.  Psychiatric/Behavioral:  Negative for depression.   All other systems reviewed and are negative.      Objective:     BP 90/60 (BP Location: Left Arm, Patient Position: Sitting, Cuff Size: Normal)   Pulse 66   Temp 97.9 F (36.6 C) (Oral)   Ht 6' 1.75" (1.873 m)   Wt 212 lb 1.6 oz (96.2 kg)   SpO2 100%   BMI 27.42 kg/m    Physical Exam Vitals reviewed.  Constitutional:      Appearance: Normal appearance. He is well-groomed and normal weight.  HENT:     Right Ear: Tympanic membrane and ear canal normal.     Left Ear: Tympanic membrane and ear canal normal.     Mouth/Throat:     Mouth: Mucous membranes are moist.     Pharynx: No posterior oropharyngeal erythema.     Comments: Post-surgical changes/asymmetry in the oropharynx, otherwise normal Eyes:     Extraocular Movements: Extraocular movements intact.     Conjunctiva/sclera: Conjunctivae normal.  Neck:     Thyroid: No thyromegaly.  Cardiovascular:     Rate and  Rhythm: Normal rate and regular rhythm.     Heart sounds: S1 normal and S2 normal. No murmur heard. Pulmonary:     Effort: Pulmonary effort is normal.     Breath sounds: Normal breath sounds and air entry. No rales.  Abdominal:     General: Abdomen is flat. Bowel sounds are normal.  Musculoskeletal:     Right lower leg: No edema.     Left lower leg: No edema.  Lymphadenopathy:     Cervical: No cervical adenopathy.  Neurological:     General: No focal deficit present.     Mental Status: He is alert and oriented to person, place, and time.     Gait: Gait is intact.  Psychiatric:        Mood and Affect: Mood and affect normal.      No results found for any  visits on 04/07/23.     Assessment & Plan:    Routine Health Maintenance and Physical Exam  Immunization History  Administered Date(s) Administered   Pfizer Covid-19 Vaccine Bivalent Booster 5y-11y 08/12/2022   Tdap 04/06/2018    Health Maintenance  Topic Date Due   OPHTHALMOLOGY EXAM  Never done   HIV Screening  Never done   Hepatitis C Screening  Never done   Diabetic kidney evaluation - eGFR measurement  04/05/2023   Diabetic kidney evaluation - Urine ACR  04/05/2023   INFLUENZA VACCINE  03/30/2023   HEMOGLOBIN A1C  04/06/2023   COVID-19 Vaccine (2 - Pfizer risk series) 04/23/2023 (Originally 09/02/2022)   Zoster Vaccines- Shingrix (1 of 2) 07/08/2023 (Originally 05/19/1988)   FOOT EXAM  10/07/2023   Colonoscopy  11/15/2024   DTaP/Tdap/Td (2 - Td or Tdap) 04/06/2028   HPV VACCINES  Aged Out    Discussed health benefits of physical activity, and encouraged him to engage in regular exercise appropriate for his age and condition.  Routine general medical examination at a health care facility  Diabetes mellitus without complication (HCC) -     Lipid panel; Future -     Comprehensive metabolic panel -     Hemoglobin A1c; Future -     Microalbumin / creatinine urine ratio -     Rosuvastatin Calcium; Take 1 tablet (10 mg total) by mouth daily.  Dispense: 90 tablet; Refill: 3 -     metFORMIN HCl; Take 1 tablet (500 mg total) by mouth 2 (two) times daily with a meal.  Dispense: 180 tablet; Refill: 1  Need for hepatitis C screening test -     Hepatitis C antibody  Encounter for screening for HIV -     HIV Antibody (routine testing w rflx)  Facial rash -     Ambulatory referral to Dermatology  Normal physical exam findings. Patient was concerned about a few spots on his face. Requesting dermatology referral. Counseled patient on healthy sleep habits, handouts given on healthy eating and exercise. I encouraged him to increase exercise to the recommended 30 minutes 5 times a  week. RTC 6 months for A1C check.   Return in 6 months (on 10/08/2023) for DM.     Karie Georges, MD

## 2023-07-03 ENCOUNTER — Telehealth: Payer: Self-pay | Admitting: *Deleted

## 2023-07-03 ENCOUNTER — Other Ambulatory Visit: Payer: Self-pay | Admitting: Family Medicine

## 2023-07-03 DIAGNOSIS — E119 Type 2 diabetes mellitus without complications: Secondary | ICD-10-CM

## 2023-07-03 MED ORDER — METFORMIN HCL 500 MG PO TABS: 500.0000 mg | ORAL_TABLET | Freq: Two times a day (BID) | ORAL | 5 refills | Status: DC

## 2023-07-03 NOTE — Telephone Encounter (Signed)
[facility] sent a note stating the patient must use [facility] or a mail order pharmacy.  Spoke with the patient, and he requested the Rx be sent to [facility] instead.

## 2023-08-02 DIAGNOSIS — C119 Malignant neoplasm of nasopharynx, unspecified: Secondary | ICD-10-CM | POA: Diagnosis not present

## 2023-09-04 ENCOUNTER — Ambulatory Visit: Payer: BC Managed Care – PPO | Admitting: Family Medicine

## 2023-09-04 VITALS — BP 110/70 | HR 65 | Temp 97.7°F | Ht 73.75 in | Wt 216.1 lb

## 2023-09-04 DIAGNOSIS — K130 Diseases of lips: Secondary | ICD-10-CM

## 2023-09-04 DIAGNOSIS — R21 Rash and other nonspecific skin eruption: Secondary | ICD-10-CM

## 2023-09-04 DIAGNOSIS — Z114 Encounter for screening for human immunodeficiency virus [HIV]: Secondary | ICD-10-CM

## 2023-09-04 DIAGNOSIS — Z1159 Encounter for screening for other viral diseases: Secondary | ICD-10-CM

## 2023-09-04 NOTE — Progress Notes (Signed)
   Established Patient Office Visit  Subjective   Patient ID: Jesse Newton, male    DOB: Jun 06, 1971  Age: 53 y.o. MRN: [id number]  Chief Complaint  Patient presents with   Lesion noted under left eye, rash on nose, scars heal slow    Pt is here to discuss a lesion under his left eye. States he has 2 red splotches on his left cheek that has been there for years, also has red splotches on his nose. He reports this one has gotten worse, spreading over both nostrils, no itching  or burning, no skin flaking, thinks that it might be from the radiation that he had for his head and neck cancer treatments. Also is reporting really chapped lips, especially on the lower right lip.  Happens only on the right side.     Current Outpatient Medications  Medication Instructions   Continuous Glucose Sensor (FREESTYLE LIBRE 3 SENSOR) MISC Place 1 sensor on the skin every 14 days. 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     Objective:     BP 110/70   Pulse 65   Temp 97.7 F (36.5 C) (Oral)   Ht 6' 1.75 (1.873 m)   Wt 216 lb 1.6 oz (98 kg)   SpO2 98%   BMI 27.93 kg/m    Physical Exam Vitals reviewed.  Cardiovascular:     Rate and Rhythm: Normal rate and regular rhythm.     Heart sounds: Normal heart sounds.  Pulmonary:     Effort: Pulmonary effort is normal.     Breath sounds: Normal breath sounds. No wheezing.           No results found for any visits on 09/04/23.    The 10-year  ASCVD risk score (Arnett DK, et al., 2019) is: 8.8%    Assessment & Plan:  Facial rash -     Ambulatory referral to Dermatology Unclear etiology, I have placed a referral to dermatology and taken photos.  Cheilosis Lips are very chapped as well, will check B12 and vitamin D   -     Vitamin B12 -     VITAMIN D  25 Hydroxy (Vit-D Deficiency, Fractures)     No follow-ups on file.    Heron CHRISTELLA Sharper, MD

## 2023-09-05 LAB — VITAMIN B12: Vitamin B-12: 599 pg/mL (ref 211–911)

## 2023-09-05 LAB — VITAMIN D 25 HYDROXY (VIT D DEFICIENCY, FRACTURES): VITD: 27.69 ng/mL — ABNORMAL LOW (ref 30.00–100.00)

## 2023-09-05 LAB — HEPATITIS C ANTIBODY: Hepatitis C Ab: NONREACTIVE

## 2023-09-05 LAB — HIV ANTIBODY (ROUTINE TESTING W REFLEX): HIV 1&2 Ab, 4th Generation: NONREACTIVE

## 2023-09-06 ENCOUNTER — Encounter: Payer: Self-pay | Admitting: Family Medicine

## 2023-09-11 DIAGNOSIS — K130 Diseases of lips: Secondary | ICD-10-CM | POA: Diagnosis not present

## 2023-09-11 DIAGNOSIS — L718 Other rosacea: Secondary | ICD-10-CM | POA: Diagnosis not present

## 2023-10-09 ENCOUNTER — Encounter: Payer: Self-pay | Admitting: Family Medicine

## 2023-10-09 ENCOUNTER — Ambulatory Visit: Payer: BC Managed Care – PPO | Admitting: Family Medicine

## 2023-10-09 VITALS — BP 96/60 | HR 67 | Temp 97.5°F | Ht 73.75 in | Wt 211.2 lb

## 2023-10-09 DIAGNOSIS — Z7984 Long term (current) use of oral hypoglycemic drugs: Secondary | ICD-10-CM

## 2023-10-09 DIAGNOSIS — E782 Mixed hyperlipidemia: Secondary | ICD-10-CM

## 2023-10-09 DIAGNOSIS — E119 Type 2 diabetes mellitus without complications: Secondary | ICD-10-CM

## 2023-10-09 DIAGNOSIS — E785 Hyperlipidemia, unspecified: Secondary | ICD-10-CM | POA: Insufficient documentation

## 2023-10-09 LAB — POCT GLYCOSYLATED HEMOGLOBIN (HGB A1C): Hemoglobin A1C: 7 % — AB (ref 4.0–5.6)

## 2023-10-09 NOTE — Progress Notes (Signed)
 Established Patient Office Visit  Subjective   Patient ID: Jesse Newton, male    DOB: 24-Dec-1970  Age: 53 y.o. MRN: [id number]  Chief Complaint  Patient presents with   Medical Management of Chronic Issues    Pt is here for follow up states he already saw the dermatologist and had laser treatments for the rosacea. States his next one is scheduled for march. States he saw [facility] dermatology.   HLD-- pt reports he stopped taking his crestor -- states that he really doesn't understand why he is taking the medication. States that he wasn't having any side effects from the medication. I counseled the patient on why statin medications help reduce his risk of heart attack and stroke.   DM-- pt reports compliance with his metformin , states that sometimes it gives him an upset stomach, sometimes he will forget to take the evening dose. No changes in his eyesight, no numbness or tingling of his extremities. Needs foot exam today. States he has an appt with his eye doctor coming up, Dr. Abraham Abo [facility]    Current Outpatient Medications  Medication Instructions   Continuous Glucose Sensor (FREESTYLE LIBRE 3 SENSOR) MISC Place 1 sensor on the skin every 14 days. 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  Constitutional:      Appearance: Normal appearance. He is normal  weight.  Cardiovascular:     Rate and Rhythm: Normal rate and regular rhythm.     Pulses: Normal pulses.     Heart sounds: Normal heart sounds. No murmur heard. Pulmonary:     Effort: Pulmonary effort is normal.     Breath sounds: Normal breath sounds. No wheezing, rhonchi or rales.  Musculoskeletal:     Right lower leg: No edema.     Left lower leg: No edema.  Neurological:     Mental Status: He is alert and oriented to person, place, and time. Mental status is at baseline.    Diabetic Foot Exam - Simple   Simple Foot Form Diabetic Foot exam was performed with the following findings: Yes 10/09/2023  9:21 AM  Visual Inspection No deformities, no ulcerations, no other skin breakdown bilaterally: Yes Sensation Testing Intact to touch and monofilament testing bilaterally: Yes Pulse Check Posterior Tibialis and Dorsalis pulse intact bilaterally: Yes Comments      Results for orders placed or performed in visit on 10/09/23  POC HgB A1c  Result Value Ref Range   Hemoglobin A1C 7.0 (A) 4.0 - 5.6 %   HbA1c POC (<> result, manual entry)     HbA1c, POC (prediabetic range)     HbA1c, POC (controlled diabetic range)        The 10-year ASCVD risk score (Arnett DK, et al., 2019) is: 7%  Assessment & Plan:  Diabetes mellitus without complication (HCC) Assessment & Plan: A1C is 7.0, well controlled on metformin  500 mg BID, I have refilled this medication for him. Foot exam performed today, will see back in 6 months for his annual physical.   Orders: -     POCT glycosylated hemoglobin (Hb A1C)  Mixed hyperlipidemia Assessment & Plan: I counseled the patient for 20 minutes on the benefits/ risk reduction when using a cholesterol medication in the setting of diabetes. Pt states he will continue his crestor  10 mg daily for CAD risk reduction. I will see him back in 6 months for his annual physical and we will recheck his labs then.      Return in about 6 months (around  04/07/2024) for annual physical exam -- fasting for bloowork.    Aida House, MD

## 2023-10-09 NOTE — Assessment & Plan Note (Signed)
 I counseled the patient for 20 minutes on the benefits/ risk reduction when using a cholesterol medication in the setting of diabetes. Pt states he will continue his crestor  10 mg daily for CAD risk reduction. I will see him back in 6 months for his annual physical and we will recheck his labs then.

## 2023-10-09 NOTE — Assessment & Plan Note (Signed)
 A1C is 7.0, well controlled on metformin  500 mg BID, I have refilled this medication for him. Foot exam performed today, will see back in 6 months for his annual physical.

## 2023-10-09 NOTE — Patient Instructions (Signed)
 Please ask the eye doctor to send me a copy of your eye exam.

## 2023-10-11 DIAGNOSIS — H906 Mixed conductive and sensorineural hearing loss, bilateral: Secondary | ICD-10-CM | POA: Diagnosis not present

## 2023-10-24 DIAGNOSIS — E119 Type 2 diabetes mellitus without complications: Secondary | ICD-10-CM | POA: Diagnosis not present

## 2023-10-24 DIAGNOSIS — D3121 Benign neoplasm of right retina: Secondary | ICD-10-CM | POA: Diagnosis not present

## 2023-10-24 DIAGNOSIS — D3122 Benign neoplasm of left retina: Secondary | ICD-10-CM | POA: Diagnosis not present

## 2024-02-28 NOTE — Progress Notes (Signed)
 [facility] Quality Team Note  Name: Jesse Newton Date of Birth: 11/28/1970 MRN: [id number] Date: 02/28/2024  [facility] Quality Team has reviewed this patient's chart, please see recommendations below:  [facility] Quality Other; (Chart reviewed. Patient needs urine microalbumin/creatinine ratio and egfr completed for gap closure.)

## 2024-03-06 DIAGNOSIS — M8738 Other secondary osteonecrosis, other site: Secondary | ICD-10-CM | POA: Diagnosis not present

## 2024-03-06 DIAGNOSIS — C119 Malignant neoplasm of nasopharynx, unspecified: Secondary | ICD-10-CM | POA: Diagnosis not present

## 2024-03-06 DIAGNOSIS — Y842 Radiological procedure and radiotherapy as the cause of abnormal reaction of the patient, or of later complication, without mention of misadventure at the time of the procedure: Secondary | ICD-10-CM | POA: Diagnosis not present

## 2024-09-13 ENCOUNTER — Encounter: Payer: Self-pay | Admitting: Family Medicine

## 2024-09-13 ENCOUNTER — Ambulatory Visit: Admitting: Family Medicine

## 2024-09-13 VITALS — BP 102/72 | HR 70 | Temp 97.6°F | Ht 73.75 in | Wt 216.1 lb

## 2024-09-13 DIAGNOSIS — Z7985 Long-term (current) use of injectable non-insulin antidiabetic drugs: Secondary | ICD-10-CM

## 2024-09-13 DIAGNOSIS — E782 Mixed hyperlipidemia: Secondary | ICD-10-CM | POA: Diagnosis not present

## 2024-09-13 DIAGNOSIS — E119 Type 2 diabetes mellitus without complications: Secondary | ICD-10-CM | POA: Diagnosis not present

## 2024-09-13 LAB — POCT GLYCOSYLATED HEMOGLOBIN (HGB A1C): Hemoglobin A1C: 7.5 % — AB (ref 4.0–5.6)

## 2024-09-13 MED ORDER — ROSUVASTATIN CALCIUM 10 MG PO TABS: 10.0000 mg | ORAL_TABLET | Freq: Every day | ORAL | 3 refills | Status: AC

## 2024-09-13 MED ORDER — OZEMPIC (0.25 OR 0.5 MG/DOSE) 2 MG/3ML ~~LOC~~ SOPN: 0.2500 mg | PEN_INJECTOR | SUBCUTANEOUS | 2 refills | Status: AC

## 2024-09-13 NOTE — Progress Notes (Signed)
 "  Established Patient Office Visit  Subjective   Patient ID: Jesse Newton, male    DOB: Dec 01, 1970  Age: 54 y.o. MRN: [id number]  Chief Complaint  Patient presents with   Medical Management of Chronic Issues    HPI  Discussed the use of AI scribe software for clinical note transcription with the patient, who gave verbal consent to proceed.  History of Present Illness   Jesse Newton is a 54 year old male with diabetes and hyperlipidemia who presents for evaluation of his diabetes management and medication review.  He stopped metformin  due to difficulty swallowing the pills, describing them as dry and causing him to choke. After stopping, he briefly felt he was urinating more but currently denies urinary frequency, weight loss, or excessive thirst. His A1c was 7.0% in February 2025 while on metformin  and has increased to 7.5% since stopping.  He also stopped ezetimibe and rosuvastatin  at the same time. His LDL was 131 mg/dL in August 7975. He did not have significant difficulty with rosuvastatin  and is open to resuming it.      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     Objective:     BP 102/72   Pulse 70   Temp 97.6 F (36.4 C) (Oral)   Ht 6' 1.75 (1.873 m)   Wt 216 lb 1.6 oz (98 kg)   SpO2 99%   BMI 27.93 kg/m    Physical Exam Vitals reviewed.  Constitutional:      Appearance: Normal appearance. He is normal weight.  Cardiovascular:     Rate and Rhythm: Normal rate and regular rhythm.     Pulses: Normal  pulses.     Heart sounds: Normal heart sounds. No murmur heard. Pulmonary:     Effort: Pulmonary effort is normal.     Breath sounds: Normal breath sounds. No wheezing, rhonchi or rales.  Musculoskeletal:     Right lower leg: No edema.     Left lower leg: No edema.  Neurological:     Mental Status: He is alert and oriented to person, place, and time. Mental status is at baseline.      Results for orders placed or performed in visit on 09/13/24  POC HgB A1c  Result Value Ref Range   Hemoglobin A1C 7.5 (A) 4.0 - 5.6 %   HbA1c POC (<> result, manual entry)     HbA1c, POC (prediabetic range)     HbA1c, POC (controlled diabetic range)        The 10-year ASCVD risk score (Arnett DK, et al., 2019) is: 8.4%    Assessment & Plan:  Diabetes mellitus without complication (HCC) -     POCT glycosylated hemoglobin (Hb A1C) -     Collection capillary blood specimen -     Comprehensive metabolic panel with GFR; Future -     Microalbumin / creatinine urine ratio; Future -     Ozempic  (0.25 or 0.5 MG/DOSE); Inject 0.25 mg into the skin once a week.  Dispense: 3 mL; Refill: 2 -  Rosuvastatin  Calcium ; Take 1 tablet (10 mg total) by mouth daily.  Dispense: 90 tablet; Refill: 3  Mixed hyperlipidemia -     Lipid panel; Future   Assessment and Plan    Type 2 diabetes mellitus A1c increased to 7.5% after discontinuation of metformin  due to swallowing difficulties. Reports increased urination and weight gain. Interested in GLP-1 receptor agonists for better management. GLP-1 receptor agonists offer cardiovascular and renal protection and are superior to metformin . Ozempic  (semaglutide ) is preferred due to its benefits in patients with diabetes, especially those with cardiovascular risk factors. Mounjaro is also effective but Ozempic  is preferred due to its additional benefits. - Prescribed Ozempic  (semaglutide ) 0.25 mg once weekly. - Documented adverse reaction to metformin . - Scheduled  follow-up in three months for A1c and lab review.  Mixed hyperlipidemia Previously on rosuvastatin  (Crestor ) but discontinued with metformin . LDL was 131 mg/dL in August 7975. Importance of resuming statin therapy to reduce cardiovascular risk discussed. Rosuvastatin  is preferred for its cardiovascular benefits. - Prescribed rosuvastatin  (Crestor ) once daily. - Advised to resume rosuvastatin  before lab work to obtain accurate cholesterol levels. - Scheduled follow-up in three months for lab review.        Return in about 3 months (around 12/12/2024) for DM.    Heron CHRISTELLA Sharper, MD  "

## 2024-10-02 ENCOUNTER — Encounter: Admitting: Family Medicine

## 2024-12-05 ENCOUNTER — Other Ambulatory Visit

## 2024-12-12 ENCOUNTER — Encounter: Admitting: Family Medicine
